# Patient Record
Sex: Male | Born: 1990 | Race: White | Hispanic: No | Marital: Married | Smoking: Never smoker
Health system: Southern US, Community
[De-identification: ages and names within clinical notes are randomized; demographics above are authoritative.]

## PROBLEM LIST (undated history)

## (undated) DIAGNOSIS — F32A Depression, unspecified: Secondary | ICD-10-CM

## (undated) DIAGNOSIS — K219 Gastro-esophageal reflux disease without esophagitis: Secondary | ICD-10-CM

## (undated) DIAGNOSIS — T7840XA Allergy, unspecified, initial encounter: Secondary | ICD-10-CM

## (undated) DIAGNOSIS — I10 Essential (primary) hypertension: Secondary | ICD-10-CM

## (undated) DIAGNOSIS — G43909 Migraine, unspecified, not intractable, without status migrainosus: Secondary | ICD-10-CM

## (undated) DIAGNOSIS — N200 Calculus of kidney: Secondary | ICD-10-CM

## (undated) HISTORY — DX: Allergy, unspecified, initial encounter: T78.40XA

## (undated) HISTORY — DX: Gastro-esophageal reflux disease without esophagitis: K21.9

## (undated) HISTORY — PX: LITHOTRIPSY: SUR834

## (undated) HISTORY — PX: KIDNEY STONE SURGERY: SHX686

## (undated) HISTORY — DX: Migraine, unspecified, not intractable, without status migrainosus: G43.909

## (undated) HISTORY — DX: Essential (primary) hypertension: I10

## (undated) HISTORY — DX: Depression, unspecified: F32.A

---

## 2015-01-09 ENCOUNTER — Emergency Department: Payer: BC Managed Care – PPO

## 2015-01-09 ENCOUNTER — Emergency Department
Admission: EM | Admit: 2015-01-09 | Discharge: 2015-01-09 | Disposition: A | Discharge status: Home / Self Care | Payer: BC Managed Care – PPO | Attending: Emergency Medicine | Admitting: Emergency Medicine

## 2015-01-09 ENCOUNTER — Encounter: Payer: Self-pay | Admitting: Medical Oncology

## 2015-01-09 DIAGNOSIS — N23 Unspecified renal colic: Secondary | ICD-10-CM | POA: Diagnosis not present

## 2015-01-09 DIAGNOSIS — R109 Unspecified abdominal pain: Secondary | ICD-10-CM | POA: Diagnosis present

## 2015-01-09 HISTORY — DX: Calculus of kidney: N20.0

## 2015-01-09 LAB — COMPREHENSIVE METABOLIC PANEL
ALT: 20 U/L (ref 17–63)
AST: 22 U/L (ref 15–41)
Albumin: 4.5 g/dL (ref 3.5–5.0)
Alkaline Phosphatase: 70 U/L (ref 38–126)
Anion gap: 9 (ref 5–15)
BILIRUBIN TOTAL: 0.6 mg/dL (ref 0.3–1.2)
BUN: 20 mg/dL (ref 6–20)
CHLORIDE: 102 mmol/L (ref 101–111)
CO2: 28 mmol/L (ref 22–32)
Calcium: 9.8 mg/dL (ref 8.9–10.3)
Creatinine, Ser: 1.05 mg/dL (ref 0.61–1.24)
GFR calc Af Amer: >60 mL/min (ref >60)
GLUCOSE: 105 mg/dL — AB (ref 65–99)
POTASSIUM: 3.6 mmol/L (ref 3.5–5.1)
SODIUM: 139 mmol/L (ref 135–145)
Total Protein: 7.2 g/dL (ref 6.5–8.1)

## 2015-01-09 LAB — URINALYSIS COMPLETE WITH MICROSCOPIC (ARMC ONLY)
Bacteria, UA: NONE SEEN
Bilirubin Urine: NEGATIVE
Glucose, UA: NEGATIVE mg/dL
Ketones, ur: NEGATIVE mg/dL
Leukocytes, UA: NEGATIVE
NITRITE: NEGATIVE
PH: 6 (ref 5.0–8.0)
Protein, ur: NEGATIVE mg/dL
SPECIFIC GRAVITY, URINE: 1.024 (ref 1.005–1.030)
Squamous Epithelial / LPF: NONE SEEN

## 2015-01-09 LAB — CBC WITH DIFFERENTIAL/PLATELET
Basophils Absolute: 0 10*3/uL (ref 0–0.1)
Basophils Relative: 0 %
EOS ABS: 0 10*3/uL (ref 0–0.7)
EOS PCT: 0 %
HCT: 41.4 % (ref 40.0–52.0)
HEMOGLOBIN: 14.5 g/dL (ref 13.0–18.0)
LYMPHS ABS: 1.1 10*3/uL (ref 1.0–3.6)
Lymphocytes Relative: 10 %
MCH: 30 pg (ref 26.0–34.0)
MCHC: 34.9 g/dL (ref 32.0–36.0)
MCV: 86 fL (ref 80.0–100.0)
MONOS PCT: 5 %
Monocytes Absolute: 0.5 10*3/uL (ref 0.2–1.0)
Neutro Abs: 8.6 10*3/uL — ABNORMAL HIGH (ref 1.4–6.5)
Neutrophils Relative %: 85 %
Platelets: 251 10*3/uL (ref 150–440)
RBC: 4.82 MIL/uL (ref 4.40–5.90)
RDW: 12.2 % (ref 11.5–14.5)
WBC: 10.3 10*3/uL (ref 3.8–10.6)

## 2015-01-09 LAB — LIPASE, BLOOD: Lipase: 23 U/L (ref 22–51)

## 2015-01-09 MED ORDER — ONDANSETRON HCL 4 MG/2ML IJ SOLN
4.0000 mg | Freq: Once | INTRAMUSCULAR | Status: AC
  Administered 2015-01-09: 4 mg via INTRAVENOUS
  Filled 2015-01-09: qty 2

## 2015-01-09 MED ORDER — ONDANSETRON HCL 4 MG PO TABS: 4.0000 mg | ORAL_TABLET | Freq: Every day | ORAL | Status: DC | PRN

## 2015-01-09 MED ORDER — OXYCODONE-ACETAMINOPHEN 5-325 MG PO TABS
2.0000 | ORAL_TABLET | Freq: Once | ORAL | Status: AC
  Administered 2015-01-09: 2 via ORAL
  Filled 2015-01-09: qty 2

## 2015-01-09 MED ORDER — KETOROLAC TROMETHAMINE 30 MG/ML IJ SOLN
30.0000 mg | Freq: Once | INTRAMUSCULAR | Status: AC
  Administered 2015-01-09: 30 mg via INTRAVENOUS
  Filled 2015-01-09: qty 1

## 2015-01-09 MED ORDER — TAMSULOSIN HCL 0.4 MG PO CAPS: 0.4000 mg | ORAL_CAPSULE | Freq: Every day | ORAL | Status: DC

## 2015-01-09 MED ORDER — SODIUM CHLORIDE 0.9 % IV SOLN
1000.0000 mL | Freq: Once | INTRAVENOUS | Status: AC
  Administered 2015-01-09: 1000 mL via INTRAVENOUS

## 2015-01-09 MED ORDER — OXYCODONE-ACETAMINOPHEN 5-325 MG PO TABS: 1.0000 | ORAL_TABLET | Freq: Four times a day (QID) | ORAL | Status: DC | PRN

## 2015-01-09 MED ORDER — TAMSULOSIN HCL 0.4 MG PO CAPS
0.4000 mg | ORAL_CAPSULE | Freq: Once | ORAL | Status: AC
  Administered 2015-01-09: 0.4 mg via ORAL
  Filled 2015-01-09: qty 1

## 2015-01-09 NOTE — ED Provider Notes (Signed)
[facility] Emergency Department Provider Note     Time seen: ----------------------------------------- 2:14 PM on 01/09/2015 -----------------------------------------    I have reviewed the triage vital signs and the nursing notes.   HISTORY  Chief Complaint Flank Pain    HPI Willie Randolph is a 24 y.o. male since ER with left flank pain that began about 30 minutes prior to arrival. [place] and stabbing, with nausea associated and near syncope. Patient dates she has a history of kidney stones and this feels similar. Pain was sudden onset, nothing makes it better or worse.   Past Medical History  Diagnosis Date  . Kidney stones     There are no active problems to display for this patient.   Past Surgical History  Procedure Laterality Date  . Kidney stone surgery      Allergies Review of patient's allergies indicates no known allergies.  Social History Social History  Substance Use Topics  . Smoking status: Never Smoker   . Smokeless tobacco: None  . Alcohol Use: Yes     Comment: occ    Review of Systems Constitutional: Negative for fever. Eyes: Negative for visual changes. ENT: Negative for sore throat. Cardiovascular: Negative for chest pain. Respiratory: Negative for shortness of breath. Gastrointestinal: Positive for left flank pain and nausea Genitourinary: Negative for dysuria. Musculoskeletal: Negative for back pain. Skin: Negative for rash. Neurological: Negative for headaches, focal weakness or numbness.  10-point ROS otherwise negative.  ____________________________________________   PHYSICAL EXAM:  VITAL SIGNS: ED Triage Vitals  Enc Vitals Group     BP 01/09/15 1408 99/61 mmHg     Pulse Rate 01/09/15 1408 65     Resp 01/09/15 1408 20     Temp 01/09/15 1408 98.4 F (36.9 C)     Temp Source 01/09/15 1408 Oral     SpO2 01/09/15 1408 98 %     Weight 01/09/15 1408 215 lb (97.523 kg)     Height 01/09/15 1408   (1.753 m)     Head Cir --      Peak Flow --      Pain Score 01/09/15 1409 10     Pain Loc --      Pain Edu? --      Excl. in GC? --     Constitutional: Alert and oriented. Mild distress Eyes: Conjunctivae are normal. PERRL. Normal extraocular movements. ENT   Head: Normocephalic and atraumatic.   Nose: No congestion/rhinnorhea.   Mouth/Throat: Mucous membranes are moist.   Neck: No stridor. Cardiovascular: Normal rate, regular rhythm. Normal and symmetric distal pulses are present in all extremities. No murmurs, rubs, or gallops. Respiratory: Normal respiratory effort without tachypnea nor retractions. Breath sounds are clear and equal bilaterally. No wheezes/rales/rhonchi. Gastrointestinal: Soft and nontender. No distention. No abdominal bruits.  Musculoskeletal: Nontender with normal range of motion in all extremities. No joint effusions.  No lower extremity tenderness nor edema. Neurologic:  Normal speech and language. No gross focal neurologic deficits are appreciated. Speech is normal. No gait instability. Skin:  Skin is warm, dry and intact. No rash noted. Psychiatric: Mood and affect are normal. Speech and behavior are normal. Patient exhibits appropriate insight and judgment. ____________________________________________  ED COURSE:  Pertinent labs & imaging results that were available during my care of the patient were reviewed by me and considered in my medical decision making (see chart for details). Patient in mild distress, will receive IV fluids, Toradol, Zofran. ____________________________________________    LABS (pertinent positives/negatives)  Labs Reviewed  URINALYSIS COMPLETEWITH MICROSCOPIC ([facility] ONLY) - Abnormal; Notable for the following:    Color, Urine YELLOW (*)    APPearance CLEAR (*)    Hgb urine dipstick 1+ (*)    All other components within normal limits  CBC WITH DIFFERENTIAL/PLATELET  COMPREHENSIVE METABOLIC PANEL  LIPASE, BLOOD     RADIOLOGY Images were viewed by me  KUB IMPRESSION: 4 mm right renal calculus. ____________________________________________  FINAL ASSESSMENT AND PLAN  Renal colic  Plan: Patient with labs and imaging as dictated above. Stone is likely too small to see on KUB. Patient with classic symptoms for renal colic with hematuria. At this point is stable for discharge with pain medication, Flomax and urology referral.   Emily Filbert, MD   Emily Filbert, MD 01/09/15 [phone]

## 2015-01-09 NOTE — Discharge Instructions (Signed)

## 2015-01-09 NOTE — ED Notes (Signed)
Pt ambulatory to triage with reports of left flank pain that began pta. Pt has hx of kidney stones, feels similar.

## 2015-01-14 ENCOUNTER — Encounter: Payer: Self-pay | Admitting: Emergency Medicine

## 2015-01-14 ENCOUNTER — Emergency Department
Admission: EM | Admit: 2015-01-14 | Discharge: 2015-01-14 | Disposition: A | Discharge status: Home / Self Care | Payer: BC Managed Care – PPO | Attending: Emergency Medicine | Admitting: Emergency Medicine

## 2015-01-14 ENCOUNTER — Encounter: Payer: Self-pay | Admitting: Urgent Care

## 2015-01-14 ENCOUNTER — Emergency Department: Payer: BC Managed Care – PPO

## 2015-01-14 DIAGNOSIS — R42 Dizziness and giddiness: Secondary | ICD-10-CM | POA: Diagnosis not present

## 2015-01-14 DIAGNOSIS — Z79899 Other long term (current) drug therapy: Secondary | ICD-10-CM | POA: Insufficient documentation

## 2015-01-14 DIAGNOSIS — R110 Nausea: Secondary | ICD-10-CM | POA: Insufficient documentation

## 2015-01-14 DIAGNOSIS — R1030 Lower abdominal pain, unspecified: Secondary | ICD-10-CM | POA: Insufficient documentation

## 2015-01-14 DIAGNOSIS — N200 Calculus of kidney: Secondary | ICD-10-CM | POA: Insufficient documentation

## 2015-01-14 DIAGNOSIS — M545 Low back pain: Secondary | ICD-10-CM | POA: Insufficient documentation

## 2015-01-14 DIAGNOSIS — R109 Unspecified abdominal pain: Secondary | ICD-10-CM | POA: Diagnosis present

## 2015-01-14 LAB — URINALYSIS COMPLETE WITH MICROSCOPIC (ARMC ONLY)
Bacteria, UA: NONE SEEN
Bilirubin Urine: NEGATIVE
Glucose, UA: NEGATIVE mg/dL
Ketones, ur: NEGATIVE mg/dL
LEUKOCYTES UA: NEGATIVE
Nitrite: NEGATIVE
PH: 6 (ref 5.0–8.0)
PROTEIN: NEGATIVE mg/dL
Specific Gravity, Urine: 1.012 (ref 1.005–1.030)

## 2015-01-14 LAB — CBC
HCT: 41.7 % (ref 40.0–52.0)
Hemoglobin: 14.4 g/dL (ref 13.0–18.0)
MCH: 29.9 pg (ref 26.0–34.0)
MCHC: 34.5 g/dL (ref 32.0–36.0)
MCV: 86.7 fL (ref 80.0–100.0)
Platelets: 235 10*3/uL (ref 150–440)
RBC: 4.81 MIL/uL (ref 4.40–5.90)
RDW: 12.4 % (ref 11.5–14.5)
WBC: 5.2 10*3/uL (ref 3.8–10.6)

## 2015-01-14 LAB — COMPREHENSIVE METABOLIC PANEL
ALT: 23 U/L (ref 17–63)
AST: 26 U/L (ref 15–41)
Albumin: 4.8 g/dL (ref 3.5–5.0)
Alkaline Phosphatase: 73 U/L (ref 38–126)
Anion gap: 10 (ref 5–15)
BUN: 15 mg/dL (ref 6–20)
CHLORIDE: 102 mmol/L (ref 101–111)
CO2: 25 mmol/L (ref 22–32)
Calcium: 9.3 mg/dL (ref 8.9–10.3)
Creatinine, Ser: 1.13 mg/dL (ref 0.61–1.24)
Glucose, Bld: 107 mg/dL — ABNORMAL HIGH (ref 65–99)
Potassium: 3.6 mmol/L (ref 3.5–5.1)
SODIUM: 137 mmol/L (ref 135–145)
Total Bilirubin: 0.5 mg/dL (ref 0.3–1.2)
Total Protein: 7.3 g/dL (ref 6.5–8.1)

## 2015-01-14 LAB — LIPASE, BLOOD: LIPASE: 23 U/L (ref 22–51)

## 2015-01-14 MED ORDER — KETOROLAC TROMETHAMINE 30 MG/ML IJ SOLN
30.0000 mg | Freq: Once | INTRAMUSCULAR | Status: AC
  Administered 2015-01-14: 30 mg via INTRAVENOUS
  Filled 2015-01-14: qty 1

## 2015-01-14 MED ORDER — ONDANSETRON HCL 4 MG/2ML IJ SOLN
INTRAMUSCULAR | Status: AC
  Administered 2015-01-14: 4 mg via INTRAVENOUS
  Filled 2015-01-14: qty 2

## 2015-01-14 MED ORDER — HYDROMORPHONE HCL 1 MG/ML IJ SOLN
1.0000 mg | Freq: Once | INTRAMUSCULAR | Status: AC
  Administered 2015-01-14: 1 mg via INTRAVENOUS

## 2015-01-14 MED ORDER — KETOROLAC TROMETHAMINE 30 MG/ML IJ SOLN
INTRAMUSCULAR | Status: AC
  Administered 2015-01-14: 30 mg via INTRAVENOUS
  Filled 2015-01-14: qty 1

## 2015-01-14 MED ORDER — HYDROMORPHONE HCL 2 MG PO TABS: 2.0000 mg | ORAL_TABLET | Freq: Two times a day (BID) | ORAL | Status: DC | PRN

## 2015-01-14 MED ORDER — HYDROMORPHONE HCL 1 MG/ML IJ SOLN
INTRAMUSCULAR | Status: AC
  Administered 2015-01-14: 1 mg via INTRAVENOUS
  Filled 2015-01-14: qty 1

## 2015-01-14 MED ORDER — HYDROMORPHONE HCL 1 MG/ML IJ SOLN
0.5000 mg | Freq: Once | INTRAMUSCULAR | Status: AC
Start: 2015-01-14 — End: 2015-01-14
  Administered 2015-01-14: 0.5 mg via INTRAVENOUS
  Filled 2015-01-14: qty 1

## 2015-01-14 MED ORDER — KETOROLAC TROMETHAMINE 30 MG/ML IJ SOLN
30.0000 mg | Freq: Once | INTRAMUSCULAR | Status: AC
  Administered 2015-01-14: 30 mg via INTRAVENOUS

## 2015-01-14 MED ORDER — ONDANSETRON HCL 4 MG/2ML IJ SOLN
4.0000 mg | Freq: Once | INTRAMUSCULAR | Status: AC
  Administered 2015-01-14: 4 mg via INTRAVENOUS
  Filled 2015-01-14: qty 2

## 2015-01-14 MED ORDER — ONDANSETRON HCL 4 MG/2ML IJ SOLN
4.0000 mg | Freq: Once | INTRAMUSCULAR | Status: AC
  Administered 2015-01-14: 4 mg via INTRAVENOUS

## 2015-01-14 NOTE — ED Notes (Signed)
Patient presents with c/o LEFT flank pain since Wednesday. (+) N/V reported. Of note, patient was seen here earlier today for the same and advised that he has a 3mm stone in his LEFT ureter. (+) hematemesis reported. Cites that this feels different than previous stones. Rx'd Dilaudid to go home per his report.

## 2015-01-14 NOTE — ED Provider Notes (Signed)
Time Seen: Approximately ----------------------------------------- 3:41 PM on 01/14/2015 -----------------------------------------    I have reviewed the triage notes  Chief Complaint: Abdominal Pain   History of Present Illness: Angeldejesus Callaham is a 24 y.o. male who presents with repeat episode of left-sided abdominal pain. The patient was seen here on August 10 and was diagnosed with renal colic. He had a plain x-ray which showed a 4 mm calculus on the right side but no obvious evidence of a calculus on the left. Skin denies any gross hematuria but does have microscopic hematuria on his last visit and also on today's lab review. He states the pain seems to be migratory toward the left lower abdominal region toward his left groin without any testicular pain or masses. He's had some nausea as felt somewhat lightheaded. No syncope he denies any fever at home.   Past Medical History  Diagnosis Date  . Kidney stones     There are no active problems to display for this patient.   Past Surgical History  Procedure Laterality Date  . Kidney stone surgery    . Lithotripsy      Past Surgical History  Procedure Laterality Date  . Kidney stone surgery    . Lithotripsy      Current Outpatient Rx  Name  Route  Sig  Dispense  Refill  . ondansetron (ZOFRAN) 4 MG tablet   Oral   Take 1 tablet (4 mg total) by mouth daily as needed for nausea or vomiting.   20 tablet   1   . oxyCODONE-acetaminophen (ROXICET) 5-325 MG per tablet   Oral   Take 1 tablet by mouth every 6 (six) hours as needed.   20 tablet   0   . tamsulosin (FLOMAX) 0.4 MG CAPS capsule   Oral   Take 1 capsule (0.4 mg total) by mouth daily after breakfast.   30 capsule   0     Allergies:  Review of patient's allergies indicates no known allergies.  Family History: No family history on file.  Social History: Social History  Substance Use Topics  . Smoking status: Never Smoker   . Smokeless tobacco: None   . Alcohol Use: Yes     Comment: occ     Review of Systems:   10 point review of systems was performed and was otherwise negative:  Constitutional: No fever Eyes: No visual disturbances ENT: No sore throat, ear pain Cardiac: No chest pain Respiratory: No shortness of breath, wheezing, or stridor Abdomen: Mainly left-sided lower back and abdominal pain, no vomiting, No diarrhea Endocrine: No weight loss, No night sweats Extremities: No peripheral edema, cyanosis Skin: No rashes, easy bruising Neurologic: No focal weakness, trouble with speech or swollowing Urologic: No dysuria, Hematuria, or urinary frequency No testicular pain or masses  Physical Exam:  ED Triage Vitals  Enc Vitals Group     BP 01/14/15 1419 150/87 mmHg     Pulse Rate 01/14/15 1419 87     Resp 01/14/15 1419 16     Temp 01/14/15 1419 98.4 F (36.9 C)     Temp Source 01/14/15 1419 Oral     SpO2 01/14/15 1419 97 %     Weight 01/14/15 1419 215 lb (97.523 kg)     Height 01/14/15 1419 5\' 9"  (1.753 m)     Head Cir --      Peak Flow --      Pain Score 01/14/15 1419 8     Pain Loc --  Pain Edu? --      Excl. in GC? --     General: Awake , Alert , and Oriented times 3; GCS 15 Head: Normal cephalic , atraumatic Eyes: Pupils equal , round, reactive to light Nose/Throat: No nasal drainage, patent upper airway without erythema or exudate.  Neck: Supple, Full range of motion, No anterior adenopathy or palpable thyroid masses Lungs: Clear to ascultation without wheezes , rhonchi, or rales Heart: Regular rate, regular rhythm without murmurs , gallops , or rubs Abdomen: Soft, non tender without rebound, guarding , or rigidity; bowel sounds positive and symmetric in all 4 quadrants. No organomegaly .        Extremities: 2 plus symmetric pulses. No edema, clubbing or cyanosis Neurologic: normal ambulation, Motor symmetric without deficits, sensory intact Skin: warm, dry, no rashes   Labs:   All laboratory  work was reviewed including any pertinent negatives or positives listed below:  Labs Reviewed  COMPREHENSIVE METABOLIC PANEL - Abnormal; Notable for the following:    Glucose, Bld 107 (*)    All other components within normal limits  URINALYSIS COMPLETEWITH MICROSCOPIC ([facility] ONLY) - Abnormal; Notable for the following:    Color, Urine YELLOW (*)    APPearance CLEAR (*)    Hgb urine dipstick 2+ (*)    Squamous Epithelial / LPF 0-5 (*)    All other components within normal limits  LIPASE, BLOOD  CBC   urine has too numerous to count red blood cells  EKG: None   Radiology:  I personally reviewed the radiologic studies   Abdominal images demonstrate a normal liver, spleen, pancreas, gallbladder and adrenal glands. Appendix is normal. Vascular structures are within normal.  Kidneys are normal in size. There is a 5 mm stone over the mid pole collecting system of the right kidney. There is minimal prominence of the upper pole collecting system of the right kidney and right renal pelvis. There is minimal prominence of the left renal collecting system. The right ureter is normal. There is a 3 mm stone at the left UVJ.  No free fluid or focal inflammatory change. Mesenteries within normal. Colon small bowel are within normal.  Pelvic images demonstrate the prostate and rectum to be within normal. No free fluid. Remaining bones and soft tissues are within normal.  IMPRESSION: Right-sided nephrolithiasis. No left renal stones. 3 mm stone at the left UVJ causing mild obstruction.   Critical Care: None    ED Course: Differential diagnosis includes but is not exclusive to acute appendicitis, renal colic, testicular torsion, urinary tract infection, prostatitis,  diverticulitis, small bowel obstruction, colitis, abdominal aortic aneurysm, gastroenteritis, etc.    Patient's stay was uneventful, he was given IV Toradol for pain along with Zofran for nausea and low-dose Dilaudid  IV. Patient's felt symptomatically improved and reviewed his laboratory work and CAT scan does not show any complications associated with his kidney stone. Patient was advised continue with his follow-up visit had been predetermined on his last visit to contact the urologist and continue the Flomax. He was also advised to take over-the-counter ibuprofen for pain if needed. Assessment: Left-sided renal colic   Final Clinical Impression: Left-sided renal colic Final diagnoses:  Left sided abdominal pain     Plan: Patient was advised to return immediately if condition worsens. Patient was advised to follow up with her primary care physician or other specialized physicians involved and in their current assessment.             Arlys John  Renaee Munda, MD 01/14/15 [phone]

## 2015-01-14 NOTE — ED Notes (Signed)
Spoke with Huel Cote, MD regarding presenting c/o and made him aware that patient was seen here earlier today by him. MD with VORB for PIV, Zofran  IV, Dilaudid  IV, and Toradol  IV. Orders to be entered and carried by this RN.

## 2015-01-14 NOTE — ED Notes (Signed)
Pt seen here the other day for kidney stone, c/o left lower back pain continues, reports no improvement with  oxycodone. Pt also reports LLQ pain.

## 2015-01-15 ENCOUNTER — Emergency Department
Admission: EM | Admit: 2015-01-15 | Discharge: 2015-01-15 | Disposition: A | Discharge status: Home / Self Care | Payer: BC Managed Care – PPO | Attending: Emergency Medicine | Admitting: Emergency Medicine

## 2015-01-15 DIAGNOSIS — N200 Calculus of kidney: Secondary | ICD-10-CM

## 2015-01-15 DIAGNOSIS — R109 Unspecified abdominal pain: Secondary | ICD-10-CM

## 2015-01-15 LAB — URINALYSIS COMPLETE WITH MICROSCOPIC (ARMC ONLY)
BILIRUBIN URINE: NEGATIVE
GLUCOSE, UA: NEGATIVE mg/dL
KETONES UR: NEGATIVE mg/dL
Leukocytes, UA: NEGATIVE
NITRITE: NEGATIVE
PH: 6 (ref 5.0–8.0)
Protein, ur: NEGATIVE mg/dL
Specific Gravity, Urine: 1.015 (ref 1.005–1.030)

## 2015-01-15 LAB — CBC
HCT: 41.7 % (ref 40.0–52.0)
Hemoglobin: 14.3 g/dL (ref 13.0–18.0)
MCH: 29.8 pg (ref 26.0–34.0)
MCHC: 34.3 g/dL (ref 32.0–36.0)
MCV: 86.8 fL (ref 80.0–100.0)
PLATELETS: 245 10*3/uL (ref 150–440)
RBC: 4.8 MIL/uL (ref 4.40–5.90)
RDW: 12.5 % (ref 11.5–14.5)
WBC: 12 10*3/uL — AB (ref 3.8–10.6)

## 2015-01-15 LAB — COMPREHENSIVE METABOLIC PANEL
ALT: 23 U/L (ref 17–63)
ANION GAP: 10 (ref 5–15)
AST: 22 U/L (ref 15–41)
Albumin: 4.9 g/dL (ref 3.5–5.0)
Alkaline Phosphatase: 74 U/L (ref 38–126)
BUN: 20 mg/dL (ref 6–20)
CHLORIDE: 102 mmol/L (ref 101–111)
CO2: 27 mmol/L (ref 22–32)
Calcium: 9.5 mg/dL (ref 8.9–10.3)
Creatinine, Ser: 1.61 mg/dL — ABNORMAL HIGH (ref 0.61–1.24)
GFR, EST NON AFRICAN AMERICAN: 59 mL/min — AB (ref >60)
Glucose, Bld: 120 mg/dL — ABNORMAL HIGH (ref 65–99)
POTASSIUM: 4.2 mmol/L (ref 3.5–5.1)
Sodium: 139 mmol/L (ref 135–145)
TOTAL PROTEIN: 7.3 g/dL (ref 6.5–8.1)
Total Bilirubin: 0.7 mg/dL (ref 0.3–1.2)

## 2015-01-15 MED ORDER — HYDROMORPHONE HCL 1 MG/ML IJ SOLN
INTRAMUSCULAR | Status: AC
  Administered 2015-01-15: 1 mg via INTRAVENOUS
  Filled 2015-01-15: qty 1

## 2015-01-15 MED ORDER — SODIUM CHLORIDE 0.9 % IV BOLUS (SEPSIS)
1000.0000 mL | Freq: Once | INTRAVENOUS | Status: AC
  Administered 2015-01-15: 1000 mL via INTRAVENOUS

## 2015-01-15 MED ORDER — HYDROMORPHONE HCL 1 MG/ML IJ SOLN
1.0000 mg | Freq: Once | INTRAMUSCULAR | Status: AC
Start: 2015-01-15 — End: 2015-01-15
  Administered 2015-01-15: 1 mg via INTRAVENOUS

## 2015-01-15 MED ORDER — ONDANSETRON HCL 4 MG/2ML IJ SOLN
4.0000 mg | Freq: Once | INTRAMUSCULAR | Status: AC
  Administered 2015-01-15: 4 mg via INTRAVENOUS

## 2015-01-15 MED ORDER — ONDANSETRON HCL 4 MG/2ML IJ SOLN
INTRAMUSCULAR | Status: AC
  Administered 2015-01-15: 4 mg via INTRAVENOUS
  Filled 2015-01-15: qty 2

## 2015-01-15 MED ORDER — HYDROMORPHONE HCL 1 MG/ML IJ SOLN
1.0000 mg | Freq: Once | INTRAMUSCULAR | Status: AC
  Administered 2015-01-15: 1 mg via INTRAVENOUS
  Filled 2015-01-15: qty 1

## 2015-01-15 NOTE — Discharge Instructions (Signed)

## 2015-01-15 NOTE — ED Provider Notes (Signed)
[facility] Emergency Department Provider Note  ____________________________________________  Time seen: Approximately 225 AM  I have reviewed the triage vital signs and the nursing notes.   HISTORY  Chief Complaint Flank Pain and Hematemesis    HPI Willie Randolph is a 24 y.o. male who was recently diagnosed with a kidney stone. The patient reports that he returned today as he was having some continued left flank pain. The patient reports that he was sent home with stronger pain medicine but as soon as he got home and started taking the medication the pain continued to worsen. The patient reports that he's had some severe left flank and abdominal pain. Patient reports that the pain seems to be between his kidney in his bladder. The patient reports that he is concerned the stone may be stuck in blocking his ureter. Currently his pain is 8 out of 10 in intensity. The patient has been vomiting with the last episode having some blood in it. The plan was to call the urologist in the morning but he reports that he does not think he can make it home anymore. The patient was initially taking oxycodone. The patient also feels some dizziness and blurred vision after the vomiting. A shunt has had a kidney stone before which required a stent to be placed.   Past Medical History  Diagnosis Date  . Kidney stones     There are no active problems to display for this patient.   Past Surgical History  Procedure Laterality Date  . Kidney stone surgery    . Lithotripsy      Current Outpatient Rx  Name  Route  Sig  Dispense  Refill  . HYDROmorphone (DILAUDID) 2 MG tablet   Oral   Take 1 tablet (2 mg total) by mouth every 12 (twelve) hours as needed for severe pain.   20 tablet   0   . ondansetron (ZOFRAN) 4 MG tablet   Oral   Take 1 tablet (4 mg total) by mouth daily as needed for nausea or vomiting.   20 tablet   1   . oxyCODONE-acetaminophen (ROXICET) 5-325 MG per  tablet   Oral   Take 1 tablet by mouth every 6 (six) hours as needed.   20 tablet   0   . tamsulosin (FLOMAX) 0.4 MG CAPS capsule   Oral   Take 1 capsule (0.4 mg total) by mouth daily after breakfast.   30 capsule   0     Allergies Review of patient's allergies indicates no known allergies.  No family history on file.  Social History Social History  Substance Use Topics  . Smoking status: Never Smoker   . Smokeless tobacco: None  . Alcohol Use: Yes     Comment: occ    Review of Systems Constitutional: No fever/chills Eyes: No visual changes. ENT: No sore throat. Cardiovascular: Denies chest pain. Respiratory: Denies shortness of breath. Gastrointestinal: Abdominal pain and flank pain nausea and vomiting Genitourinary: Negative for dysuria. Musculoskeletal: Left flank pain Skin: Negative for rash. Neurological: Dizziness  10-point ROS otherwise negative.  ____________________________________________   PHYSICAL EXAM:  VITAL SIGNS: ED Triage Vitals  Enc Vitals Group     BP 01/14/15 2250 150/67 mmHg     Pulse Rate 01/14/15 2250 85     Resp 01/14/15 2250 20     Temp 01/14/15 2250 98.4 F (36.9 C)     Temp Source 01/14/15 2250 Oral     SpO2 01/14/15 2250 100 %  Weight 01/14/15 2250 215 lb (97.523 kg)     Height 01/14/15 2250  (1.753 m)     Head Cir --      Peak Flow --      Pain Score 01/14/15 2250 10     Pain Loc --      Pain Edu? --      Excl. in GC? --     Constitutional: Alert and oriented. Well appearing and in moderate distress. Eyes: Conjunctivae are normal. PERRL. EOMI. Head: Atraumatic. Nose: No congestion/rhinnorhea. Mouth/Throat: Mucous membranes are moist.  Oropharynx non-erythematous. Cardiovascular: Normal rate, regular rhythm. Grossly normal heart sounds.  Good peripheral circulation. Respiratory: Normal respiratory effort.  No retractions. Lungs CTAB. Gastrointestinal: Soft with left-sided abdominal pain to palpation. No  distention. Positive bowel sounds, left CVA tenderness to palpation Musculoskeletal: No lower extremity tenderness nor edema.   Neurologic:  Normal speech and language. No gross focal neurologic deficits are appreciated.  Skin:  Skin is warm, dry and intact.  Psychiatric: Mood and affect are normal. .  ____________________________________________   LABS (all labs ordered are listed, but only abnormal results are displayed)  Labs Reviewed  CBC - Abnormal; Notable for the following:    WBC 12.0 (*)    All other components within normal limits  COMPREHENSIVE METABOLIC PANEL - Abnormal; Notable for the following:    Glucose, Bld 120 (*)    Creatinine, Ser 1.61 (*)    GFR calc non Af Amer 59 (*)    All other components within normal limits  URINALYSIS COMPLETEWITH MICROSCOPIC ([facility] ONLY) - Abnormal; Notable for the following:    Color, Urine YELLOW (*)    APPearance CLEAR (*)    Hgb urine dipstick 3+ (*)    Bacteria, UA RARE (*)    Squamous Epithelial / LPF 0-5 (*)    All other components within normal limits   ____________________________________________  EKG  None ____________________________________________  RADIOLOGY  None ____________________________________________   PROCEDURES  Procedure(s) performed: None  Critical Care performed: No  ____________________________________________   INITIAL IMPRESSION / ASSESSMENT AND PLAN / ED COURSE  Pertinent labs & imaging results that were available during my care of the patient were reviewed by me and considered in my medical decision making (see chart for details).  This is a 24 year old male with a history of kidney stone comes in today with left flank pain and recent diagnosis of kidney stone. The patient reports that he would like to be admitted to the hospital. I will contact the urologist to determine admission versus follow-up in the morning. The patient did receive 3 mg of Dilaudid as well as Zofran and normal  saline.   I contacted the urologist on-call Dr. Ronne Binning and he agreed that he would be able to see the patient in the clinic today. The patient needs to call to make his appointment today. The patient will be discharged home to follow-up with his urologist today. ____________________________________________   FINAL CLINICAL IMPRESSION(S) / ED DIAGNOSES  Final diagnoses:  Flank pain  Kidney stone      Rebecka Apley, MD 01/15/15 [phone]

## 2015-01-15 NOTE — ED Notes (Signed)
Patient has been to the desk several times asking for pain medications. Patient with known kidney stone and pain has increased back to a 10/10. Of note, this RN in another assignment, however order obtained and nursing monitoring patient made aware. Patient back to desk advising that he had not been given any additional medications. This RN obtained drugs from pyxis and administered per MD order.

## 2016-04-09 IMAGING — CT CT RENAL STONE PROTOCOL
1 of 2 series · 4 of 32 positions shown, 9 images · non-contrast
Comparison: None.

CLINICAL DATA: Left lower back pain and left lower quadrant pain.
History of kidney stones.

EXAM:
CT ABDOMEN AND PELVIS WITHOUT CONTRAST
TECHNIQUE: Multidetector CT imaging of the abdomen and pelvis was performed
following the standard protocol without IV contrast.

[Series 4: lung windows · axial · 0.81mm/px · z∈[+22,+87]mm · 4 of 23 slices shown, 9 images]
[im 5/23  soft-tissue]
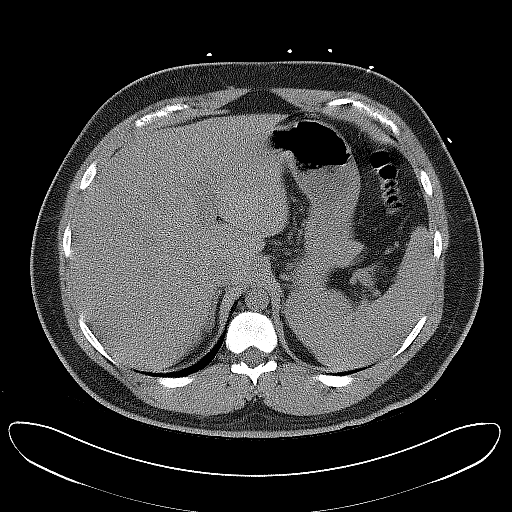
[im 5/23  lung]
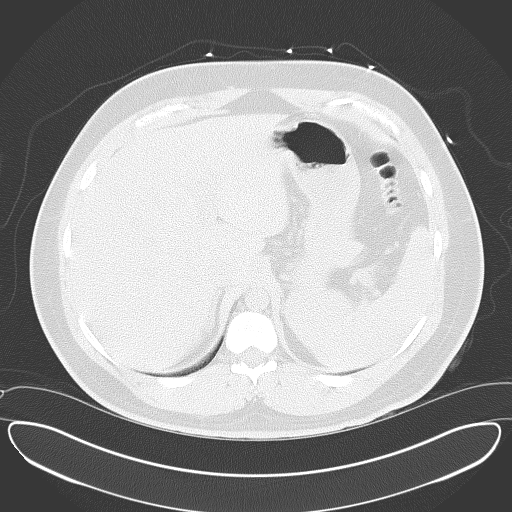
[im 5/23  bone]
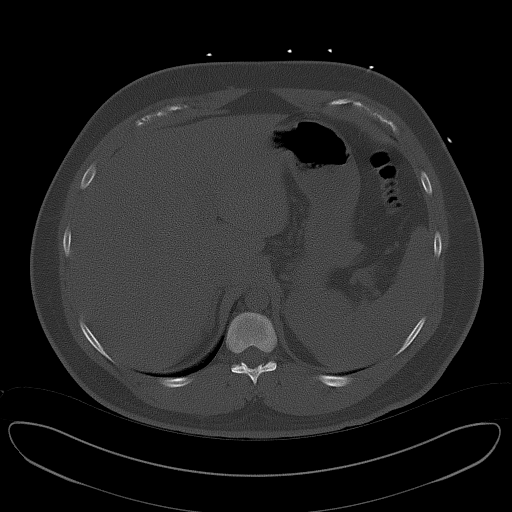
[im 9/23  soft-tissue]
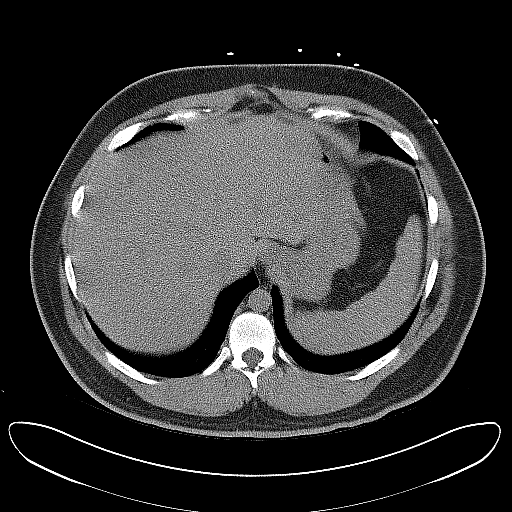
[im 9/23  lung]
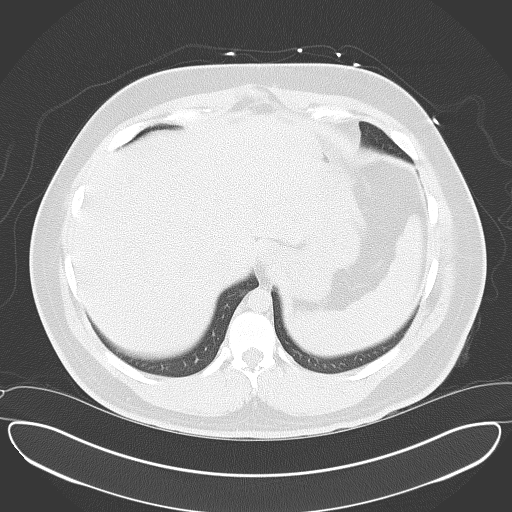
[im 14/23  soft-tissue]
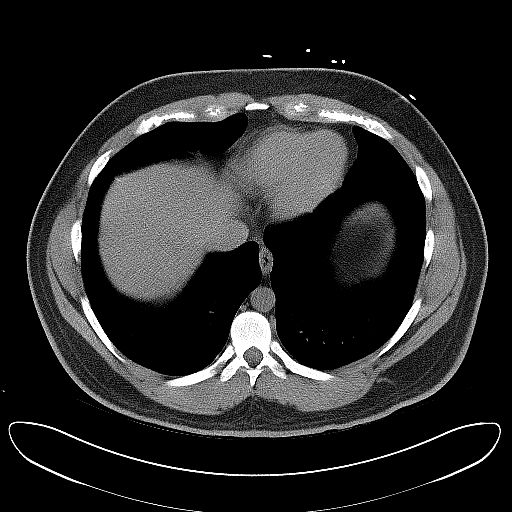
[im 14/23  lung]
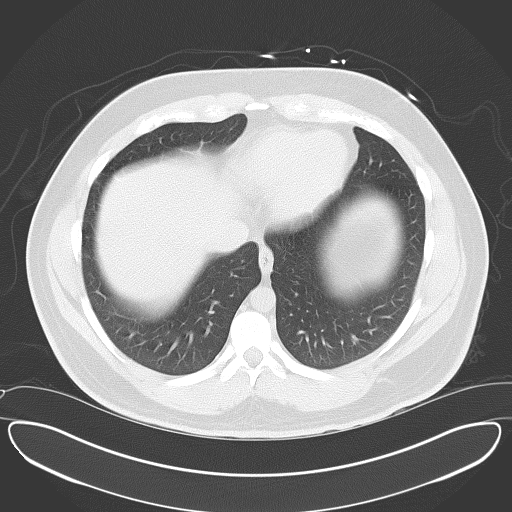
[im 18/23  soft-tissue]
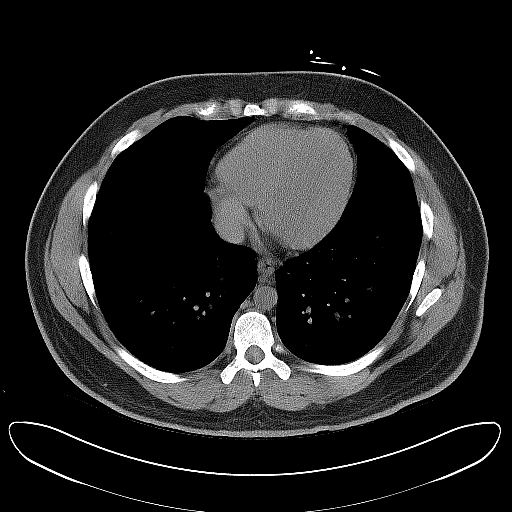
[im 18/23  lung]
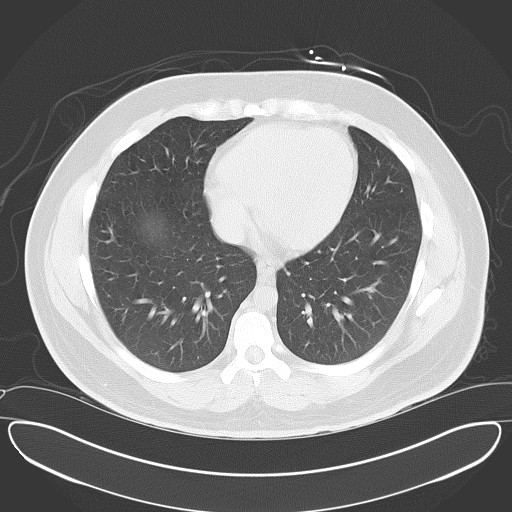

[4 of 32 positions shown; findings below may reference images not displayed]

FINDINGS: Lung bases are within normal.

Abdominal images demonstrate a normal liver, spleen, pancreas,
gallbladder and adrenal glands. Appendix is normal. Vascular
structures are within normal.

Kidneys are normal in size. There is a 5 mm stone over the mid pole
collecting system of the right kidney. There is minimal prominence
of the upper pole collecting system of the right kidney and right
renal pelvis. There is minimal prominence of the left renal
collecting system. The right ureter is normal. There is a 3 mm stone
at the left UVJ.

No free fluid or focal inflammatory change. Mesenteries within
normal. Colon small bowel are within normal.

Pelvic images demonstrate the prostate and rectum to be within
normal. No free fluid. Remaining bones and soft tissues are within
normal.
IMPRESSION: Right-sided nephrolithiasis. No left renal stones. 3 mm stone at the
left UVJ causing mild obstruction.

## 2019-02-03 LAB — TSH: TSH: 2.22 (ref <=5.90)

## 2019-07-30 ENCOUNTER — Ambulatory Visit: Payer: BC Managed Care – PPO | Attending: Internal Medicine

## 2019-07-30 DIAGNOSIS — Z23 Encounter for immunization: Secondary | ICD-10-CM | POA: Insufficient documentation

## 2019-07-30 NOTE — Progress Notes (Signed)
   Covid-19 Vaccination Clinic  Name:  Willie Randolph    MRN: [id number] DOB: [birth year]  07/30/2019  Willie Randolph was observed post Covid-19 immunization for 15 minutes without incidence. He was provided with Vaccine Information Sheet and instruction to access the V-Safe system.   Willie Randolph was instructed to call 911 with any severe reactions post vaccine: [name] Kitchen Difficulty breathing  . Swelling of your face and throat  . A fast heartbeat  . A bad rash all over your body  . Dizziness and weakness    Immunizations Administered    Name Date Dose VIS Date Route   Pfizer COVID-19 Vaccine 07/30/2019  3:31 PM 0.3 mL 05/12/2019 Intramuscular   Manufacturer: [employer], [employer]   Lot: [id number]   NDC: [address]-8

## 2019-08-26 ENCOUNTER — Ambulatory Visit: Payer: BC Managed Care – PPO

## 2019-08-30 ENCOUNTER — Ambulatory Visit: Payer: BC Managed Care – PPO | Attending: Internal Medicine

## 2019-08-30 DIAGNOSIS — Z23 Encounter for immunization: Secondary | ICD-10-CM

## 2019-08-30 NOTE — Progress Notes (Signed)
   Covid-19 Vaccination Clinic  Name:  Willie Randolph    MRN: [id number] DOB: 1990-06-26  08/30/2019  Mr. [name] was observed post Covid-19 immunization for 15 minutes without incident. He was provided with Vaccine Information Sheet and instruction to access the V-Safe system.   Mr. [name] was instructed to call 911 with any severe reactions post vaccine: [name] Kitchen Difficulty breathing  . Swelling of face and throat  . A fast heartbeat  . A bad rash all over body  . Dizziness and weakness   Immunizations Administered    Name Date Dose VIS Date Route   Pfizer COVID-19 Vaccine 08/30/2019  3:10 PM 0.3 mL 05/12/2019 Intramuscular   Manufacturer: [employer], [employer]   Lot: [id number]   NDC: [address]-9

## 2019-09-05 LAB — BASIC METABOLIC PANEL
BUN: 14 (ref 4–21)
Creatinine: 1.1 (ref 0.6–1.3)
Glucose: 97
Potassium: 4.6 (ref 3.4–5.3)
Sodium: 138 (ref 137–147)

## 2019-09-05 LAB — LIPID PANEL
Cholesterol: 162 (ref 0–200)
HDL: 31 — AB (ref 35–70)
LDL Cholesterol: 84
Triglycerides: 283 — AB (ref 40–160)

## 2019-09-05 LAB — HEPATIC FUNCTION PANEL: ALT: 28 (ref 10–40)

## 2019-09-05 LAB — COMPREHENSIVE METABOLIC PANEL: GFR calc non Af Amer: 92

## 2020-04-15 ENCOUNTER — Other Ambulatory Visit: Payer: Self-pay

## 2020-04-15 ENCOUNTER — Ambulatory Visit (INDEPENDENT_AMBULATORY_CARE_PROVIDER_SITE_OTHER): Payer: BC Managed Care – PPO | Admitting: Family Medicine

## 2020-04-15 ENCOUNTER — Encounter: Payer: Self-pay | Admitting: Family Medicine

## 2020-04-15 VITALS — BP 121/73 | HR 83 | Temp 97.7°F | Resp 16 | Ht 67.0 in | Wt 250.0 lb

## 2020-04-15 DIAGNOSIS — F331 Major depressive disorder, recurrent, moderate: Secondary | ICD-10-CM | POA: Insufficient documentation

## 2020-04-15 DIAGNOSIS — Z7689 Persons encountering health services in other specified circumstances: Secondary | ICD-10-CM | POA: Diagnosis not present

## 2020-04-15 DIAGNOSIS — F330 Major depressive disorder, recurrent, mild: Secondary | ICD-10-CM | POA: Diagnosis not present

## 2020-04-15 DIAGNOSIS — E785 Hyperlipidemia, unspecified: Secondary | ICD-10-CM

## 2020-04-15 DIAGNOSIS — F319 Bipolar disorder, unspecified: Secondary | ICD-10-CM | POA: Insufficient documentation

## 2020-04-15 DIAGNOSIS — F411 Generalized anxiety disorder: Secondary | ICD-10-CM

## 2020-04-15 DIAGNOSIS — E782 Mixed hyperlipidemia: Secondary | ICD-10-CM | POA: Insufficient documentation

## 2020-04-15 DIAGNOSIS — I10 Essential (primary) hypertension: Secondary | ICD-10-CM | POA: Diagnosis not present

## 2020-04-15 DIAGNOSIS — J3089 Other allergic rhinitis: Secondary | ICD-10-CM

## 2020-04-15 MED ORDER — ESCITALOPRAM OXALATE 20 MG PO TABS: 20.0000 mg | ORAL_TABLET | Freq: Every day | ORAL | 1 refills | Status: DC

## 2020-04-15 MED ORDER — MONTELUKAST SODIUM 10 MG PO TABS: 10.0000 mg | ORAL_TABLET | Freq: Every day | ORAL | 1 refills | Status: DC

## 2020-04-15 MED ORDER — LISINOPRIL 10 MG PO TABS: 10.0000 mg | ORAL_TABLET | Freq: Every day | ORAL | 1 refills | Status: DC

## 2020-04-15 MED ORDER — ATORVASTATIN CALCIUM 20 MG PO TABS: 20.0000 mg | ORAL_TABLET | Freq: Every day | ORAL | 1 refills | Status: DC

## 2020-04-15 MED ORDER — TRIAMTERENE-HCTZ 37.5-25 MG PO CAPS: 1.0000 | ORAL_CAPSULE | Freq: Every day | ORAL | 1 refills | Status: DC

## 2020-04-15 MED ORDER — ATENOLOL 25 MG PO TABS: 25.0000 mg | ORAL_TABLET | Freq: Every day | ORAL | 1 refills | Status: DC

## 2020-04-15 NOTE — Patient Instructions (Addendum)
Thank you for coming to the office today.  For the mood/anxiety - options are - 1 keep current dose Lexapro 10 - and monitor symptoms longer. May be underlying mood, anxiety, or inattention/focus or other issues. Can just be life stressors. - option 2) - double dose of Lexapro take 2 tab of the 10mg  instead of 1, and see if higher dose is effective  Option 3) we can consider add on new medication such as Wellbutrin or Buspar to help either mood or anxiety.  We chose option #2  New rx Lexapro generic 20mg  sent to pharmacy, try this - if you preferred the 10mg  in the future, cut the tab in half, and let me know. -----------------------------------------  Refilled all other meds.  May phase off Dicyclomine or use as needed only.  DUE for FASTING BLOOD WORK (no food or drink after midnight before the lab appointment, only water or coffee without cream/sugar on the morning of)  SCHEDULE "Lab Only" visit in the morning at the clinic for lab draw in 3 MONTHS   - Make sure Lab Only appointment is at about 1 week before your next appointment, so that results will be available  For Lab Results, once available within 2-3 days of blood draw, you can can log in to MyChart online to view your results and a brief explanation. Also, we can discuss results at next follow-up visit.   Please schedule a Follow-up Appointment to: Return in about 3 months (around 07/16/2020) for 3 month fasting lab only then 1 week later Annual Physical.  If you have any other questions or concerns, please feel free to call the office or send a message through MyChart. You may also schedule an earlier appointment if necessary.  Additionally, you may be receiving a survey about your experience at our office within a few days to 1 week by e-mail or mail. We value your feedback.  , DO [facility], 07/18/2020

## 2020-04-15 NOTE — Progress Notes (Signed)
Subjective:    Patient ID: Willie Randolph, male    DOB: 1990-12-09, 29 y.o.   MRN: [id number]  Willie Randolph is a 29 y.o. male presenting on 04/15/2020 for Establish Care and Hypertension  Previously relocated to [place] in 2015. Previous PCP Dr Carollee Massed in [place]  He teachers computer science at [employer]  HPI   Major Depression, recurrent chronic Generalized Anxiety GAD History of long term issue with mood depression episodic flares. He was told in past may have some sort of anxious or irritable ADHD-like symptoms, questioning if side effect of medication. Strong fam history of depression and mood symptoms, siblings, father, mother. - Has not seen Psychiatry or therapist in past. Only PCP - He is currently on Lexapro 10mg  daily - has not been on other doses - Previously on Celexa, had fatigue or tired then switched to Lexapro. - He says he focuses on things out of his control and causes some worries and stressors. He often is bringing   CHRONIC HTN: Morbid Obesity BMI >39 Reports chronic history of HTN, has been controlled on HTN. Current Meds - Atenolol 25mg  daily, Lisinopril 10mg  daily, Dyazide (triamterene-HCTZ) 37.5-25mg  daily   Reports good compliance, took meds today. Tolerating well, w/o complaints. Denies CP, dyspnea, HA, edema, dizziness / lightheadedness  HYPERLIPIDEMIA: - Reports no concerns. Last lipid panel about 1 year. No results available today - Currently taking Atorvastatin 20mg  daily, tolerating well without side effects or myalgias  GERD Taking Famotidine 20mg  BID  History of Kidney Stones History of x 3 stones in past. He had a 15mm stone in 2011 had a surgical removal of kidney stone at that time by Urology near [facility].   Health Maintenance: UTD COVID vaccine and booster UTD Flu vaccine  Depression screen [facility] 2/9 04/15/2020  Decreased Interest 1  Down, Depressed, Hopeless 1  PHQ - 2 Score 2  Altered sleeping 0    Tired, decreased energy 1  Change in appetite 1  Feeling bad or failure about yourself  1  Trouble concentrating 2  Moving slowly or fidgety/restless 0  PHQ-9 Score 7  Difficult doing work/chores Somewhat difficult   GAD 7 : Generalized Anxiety Score 04/15/2020  Nervous, Anxious, on Edge 1  Control/stop worrying 1  Worry too much - different things 2  Trouble relaxing 1  Restless 1  Easily annoyed or irritable 2  Afraid - awful might happen 0  Total GAD 7 Score 8  Anxiety Difficulty Somewhat difficult      Past Medical History:  Diagnosis Date  . Allergy   . Depression   . GERD (gastroesophageal reflux disease)   . Hypertension   . Kidney stones    Past Surgical History:  Procedure Laterality Date  . KIDNEY STONE SURGERY    . LITHOTRIPSY     Social History   Socioeconomic History  . Marital status: Single    Spouse name: Not on file  . Number of children: Not on file  . Years of education: Not on file  . Highest education level: Not on file  Occupational History  . Not on file  Tobacco Use  . Smoking status: Never Smoker  . Smokeless tobacco: Never Used  Substance and Sexual Activity  . Alcohol use: Yes    Comment: occ  . Drug use: No  . Sexual activity: Not on file  Other Topics Concern  . Not on file  Social History Narrative  . Not on file  Social Determinants of Health   Financial Resource Strain:   . Difficulty of Paying Living Expenses: Not on file  Food Insecurity:   . Worried About Programme researcher, broadcasting/film/video in the Last Year: Not on file  . Ran Out of Food in the Last Year: Not on file  Transportation Needs:   . Lack of Transportation (Medical): Not on file  . Lack of Transportation (Non-Medical): Not on file  Physical Activity:   . Days of Exercise per Week: Not on file  . Minutes of Exercise per Session: Not on file  Stress:   . Feeling of Stress : Not on file  Social Connections:   . Frequency of Communication with Friends and Family:  Not on file  . Frequency of Social Gatherings with Friends and Family: Not on file  . Attends Religious Services: Not on file  . Active Member of Clubs or Organizations: Not on file  . Attends Banker Meetings: Not on file  . Marital Status: Not on file  Intimate Partner Violence:   . Fear of Current or Ex-Partner: Not on file  . Emotionally Abused: Not on file  . Physically Abused: Not on file  . Sexually Abused: Not on file   Family History  Problem Relation Age of Onset  . Depression Mother   . Heart disease Father   . Diabetes Father   . Depression Father   . Depression Brother   . Heart attack Maternal Grandfather 83  . COPD Paternal Grandmother 67  . Heart attack Paternal Grandfather 74   Current Outpatient Medications on File Prior to Visit  Medication Sig  . dicyclomine (BENTYL) 10 MG capsule Take 10 mg by mouth 2 (two) times daily as needed.  . famotidine (PEPCID) 20 MG tablet Take 20 mg by mouth 2 (two) times daily.   No current facility-administered medications on file prior to visit.    Review of Systems Per HPI unless specifically indicated above     Objective:    BP 121/73   Pulse 83   Temp 97.7 F (36.5 C) (Temporal)   Resp 16   Ht 5\' 7"  (1.702 m)   Wt 250 lb (113.4 kg)   SpO2 100%   BMI 39.16 kg/m   Wt Readings from Last 3 Encounters:  04/15/20 250 lb (113.4 kg)  01/14/15 215 lb (97.5 kg)  01/14/15 215 lb (97.5 kg)    Physical Exam Vitals and nursing note reviewed.  Constitutional:      General: He is not in acute distress.    Appearance: He is well-developed. He is obese. He is not diaphoretic.     Comments: Well-appearing, comfortable, cooperative  HENT:     Head: Normocephalic and atraumatic.  Eyes:     General:        Right eye: No discharge.        Left eye: No discharge.     Conjunctiva/sclera: Conjunctivae normal.  Cardiovascular:     Rate and Rhythm: Normal rate.  Pulmonary:     Effort: Pulmonary effort is  normal.  Skin:    General: Skin is warm and dry.     Findings: No erythema or rash.  Neurological:     Mental Status: He is alert and oriented to person, place, and time.  Psychiatric:        Behavior: Behavior normal.     Comments: Well groomed, good eye contact, normal speech and thoughts       Results for  orders placed or performed during the hospital encounter of 01/15/15  CBC  Result Value Ref Range   WBC 12.0 (H) 3.8 - 10.6 K/uL   RBC 4.80 4.40 - 5.90 MIL/uL   Hemoglobin 14.3 13.0 - 18.0 g/dL   HCT 34.1 40 - 52 %   MCV 86.8 80.0 - 100.0 fL   MCH 29.8 26.0 - 34.0 pg   MCHC 34.3 32.0 - 36.0 g/dL   RDW 96.2 22.9 - 79.8 %   Platelets 245 150 - 440 K/uL  Comprehensive metabolic panel  Result Value Ref Range   Sodium 139 135 - 145 mmol/L   Potassium 4.2 3.5 - 5.1 mmol/L   Chloride 102 101 - 111 mmol/L   CO2 27 22 - 32 mmol/L   Glucose, Bld 120 (H) 65 - 99 mg/dL   BUN 20 6 - 20 mg/dL   Creatinine, Ser 9.21 (H) 0.61 - 1.24 mg/dL   Calcium 9.5 8.9 - 19.4 mg/dL   Total Protein 7.3 6.5 - 8.1 g/dL   Albumin 4.9 3.5 - 5.0 g/dL   AST 22 15 - 41 U/L   ALT 23 17 - 63 U/L   Alkaline Phosphatase 74 38 - 126 U/L   Total Bilirubin 0.7 0.3 - 1.2 mg/dL   GFR calc non Af Amer 59 (L) >60 mL/min   GFR calc Af Amer >60 >60 mL/min   Anion gap 10 5 - 15  Urinalysis complete, with microscopic ([facility] only)  Result Value Ref Range   Color, Urine YELLOW (A) YELLOW   APPearance CLEAR (A) CLEAR   Glucose, UA NEGATIVE NEGATIVE mg/dL   Bilirubin Urine NEGATIVE NEGATIVE   Ketones, ur NEGATIVE NEGATIVE mg/dL   Specific Gravity, Urine 1.015 1.005 - 1.030   Hgb urine dipstick 3+ (A) NEGATIVE   pH 6.0 5.0 - 8.0   Protein, ur NEGATIVE NEGATIVE mg/dL   Nitrite NEGATIVE NEGATIVE   Leukocytes, UA NEGATIVE NEGATIVE   RBC / HPF TOO NUMEROUS TO COUNT 0 - 5 RBC/hpf   WBC, UA 0-5 0 - 5 WBC/hpf   Bacteria, UA RARE (A) NONE SEEN   Squamous Epithelial / LPF 0-5 (A) NONE SEEN   Mucus PRESENT    Hyaline  Casts, UA PRESENT       Assessment & Plan:   Problem List Items Addressed This Visit    Morbid obesity (HCC)    BMI >39 with comorbid factors major depression, HTN, GERD Encourage lifestyle diet exercise.      Major depressive disorder, recurrent, mild (HCC) - Primary   Relevant Medications   escitalopram (LEXAPRO) 20 MG tablet   Hyperlipidemia   Relevant Medications   atenolol (TENORMIN) 25 MG tablet   atorvastatin (LIPITOR) 20 MG tablet   lisinopril (ZESTRIL) 10 MG tablet   triamterene-hydrochlorothiazide (DYAZIDE) 37.5-25 MG capsule   GAD (generalized anxiety disorder)   Relevant Medications   escitalopram (LEXAPRO) 20 MG tablet   Essential hypertension   Relevant Medications   atenolol (TENORMIN) 25 MG tablet   atorvastatin (LIPITOR) 20 MG tablet   lisinopril (ZESTRIL) 10 MG tablet   triamterene-hydrochlorothiazide (DYAZIDE) 37.5-25 MG capsule   Environmental and seasonal allergies   Relevant Medications   montelukast (SINGULAIR) 10 MG tablet    Other Visit Diagnoses    Encounter to establish care with new doctor          Request outside records from prior PCP Dr Lyn Hollingshead Sandre Kitty.  #HLD Previously controlled, last lab >1 year Re order Atorvastatin 20mg   daily Request record, check labs in 3 months  #HTN Controlled on current regimen Continue / refill - Atenolol 25mg  daily, Lisinopril 10mg  daily, Triamterene-HCTZ 37.5-25mg  daily  #Allergies Refill Singulair  #Major depression, recurrent mild / GAD Anxiety Chronic problem, seems has some breakthrough symptoms question mood vs anxiety Not followed by Psychiatry or therapy Will increase Lexapro from 10 to 20 now and follow up as planned, sooner if need, consider add on therapy Wellbutrin vs Buspar Future consider counseling/therapy  Meds ordered this encounter  Medications  . atenolol (TENORMIN) 25 MG tablet    Sig: Take 1 tablet (25 mg total) by mouth daily.    Dispense:  90 tablet    Refill:  1    . atorvastatin (LIPITOR) 20 MG tablet    Sig: Take 1 tablet (20 mg total) by mouth daily.    Dispense:  90 tablet    Refill:  1  . escitalopram (LEXAPRO) 20 MG tablet    Sig: Take 1 tablet (20 mg total) by mouth daily.    Dispense:  90 tablet    Refill:  1    Dose increase from 10 to 20  . lisinopril (ZESTRIL) 10 MG tablet    Sig: Take 1 tablet (10 mg total) by mouth daily.    Dispense:  90 tablet    Refill:  1  . montelukast (SINGULAIR) 10 MG tablet    Sig: Take 1 tablet (10 mg total) by mouth at bedtime.    Dispense:  90 tablet    Refill:  1  . triamterene-hydrochlorothiazide (DYAZIDE) 37.5-25 MG capsule    Sig: Take 1 each (1 capsule total) by mouth daily.    Dispense:  90 capsule    Refill:  1      Follow up plan: Return in about 3 months (around 07/16/2020) for 3 month fasting lab only then 1 week later Annual Physical.  Saralyn PilarAlexander Iver Miklas, DO [facility] 04/15/2020, 3:46 PM

## 2020-04-15 NOTE — Assessment & Plan Note (Signed)
BMI >39 with comorbid factors major depression, HTN, GERD Encourage lifestyle diet exercise.

## 2020-05-07 ENCOUNTER — Encounter: Payer: Self-pay | Admitting: Family Medicine

## 2020-07-14 ENCOUNTER — Other Ambulatory Visit: Payer: Self-pay | Admitting: Family Medicine

## 2020-07-14 DIAGNOSIS — F411 Generalized anxiety disorder: Secondary | ICD-10-CM

## 2020-07-14 DIAGNOSIS — J3089 Other allergic rhinitis: Secondary | ICD-10-CM

## 2020-07-14 DIAGNOSIS — E785 Hyperlipidemia, unspecified: Secondary | ICD-10-CM

## 2020-07-14 DIAGNOSIS — I10 Essential (primary) hypertension: Secondary | ICD-10-CM

## 2020-07-14 DIAGNOSIS — F330 Major depressive disorder, recurrent, mild: Secondary | ICD-10-CM

## 2020-07-22 ENCOUNTER — Other Ambulatory Visit: Payer: Self-pay

## 2020-07-22 ENCOUNTER — Encounter: Payer: BC Managed Care – PPO | Admitting: Family Medicine

## 2020-07-22 DIAGNOSIS — I10 Essential (primary) hypertension: Secondary | ICD-10-CM

## 2020-07-22 DIAGNOSIS — E785 Hyperlipidemia, unspecified: Secondary | ICD-10-CM

## 2020-07-22 DIAGNOSIS — Z0000 Encounter for general adult medical examination without abnormal findings: Secondary | ICD-10-CM

## 2020-07-22 LAB — CBC WITH DIFFERENTIAL/PLATELET
Absolute Monocytes: 450 cells/uL (ref 200–950)
Basophils Relative: 0.9 %
Lymphs Abs: 2052 cells/uL (ref 850–3900)
MCV: 87.6 fL (ref 80.0–100.0)
Monocytes Relative: 7.9 %
Neutro Abs: 3118 cells/uL (ref 1500–7800)
Platelets: 283 10*3/uL (ref 140–400)

## 2020-07-22 LAB — COMPREHENSIVE METABOLIC PANEL: Glucose, Bld: 91 mg/dL (ref 65–99)

## 2020-07-23 LAB — CBC WITH DIFFERENTIAL/PLATELET
Basophils Absolute: 51 cells/uL (ref 0–200)
Eosinophils Absolute: 29 cells/uL (ref 15–500)
Eosinophils Relative: 0.5 %
HCT: 41.1 % (ref 38.5–50.0)
Hemoglobin: 14.2 g/dL (ref 13.2–17.1)
MCH: 30.3 pg (ref 27.0–33.0)
MCHC: 34.5 g/dL (ref 32.0–36.0)
MPV: 11.2 fL (ref 7.5–12.5)
Neutrophils Relative %: 54.7 %
RBC: 4.69 10*6/uL (ref 4.20–5.80)
RDW: 12.2 % (ref 11.0–15.0)
Total Lymphocyte: 36 %
WBC: 5.7 10*3/uL (ref 3.8–10.8)

## 2020-07-23 LAB — LIPID PANEL
Cholesterol: 150 mg/dL (ref <200)
HDL: 35 mg/dL — ABNORMAL LOW (ref >=40)
LDL Cholesterol (Calc): 82 mg/dL (calc)
Non-HDL Cholesterol (Calc): 115 mg/dL (calc) (ref <130)
Total CHOL/HDL Ratio: 4.3 (calc) (ref <5.0)
Triglycerides: 238 mg/dL — ABNORMAL HIGH (ref <150)

## 2020-07-23 LAB — HEMOGLOBIN A1C
Hgb A1c MFr Bld: 5.4 % of total Hgb (ref <5.7)
Mean Plasma Glucose: 108 mg/dL
eAG (mmol/L): 6 mmol/L

## 2020-07-23 LAB — COMPREHENSIVE METABOLIC PANEL
AG Ratio: 2 (calc) (ref 1.0–2.5)
ALT: 18 U/L (ref 9–46)
AST: 12 U/L (ref 10–40)
Albumin: 4.5 g/dL (ref 3.6–5.1)
Alkaline phosphatase (APISO): 96 U/L (ref 36–130)
BUN: 14 mg/dL (ref 7–25)
CO2: 25 mmol/L (ref 20–32)
Calcium: 9.6 mg/dL (ref 8.6–10.3)
Chloride: 104 mmol/L (ref 98–110)
Creat: 1.02 mg/dL (ref 0.60–1.35)
Globulin: 2.3 g/dL (calc) (ref 1.9–3.7)
Potassium: 4.3 mmol/L (ref 3.5–5.3)
Sodium: 139 mmol/L (ref 135–146)
Total Bilirubin: 0.3 mg/dL (ref 0.2–1.2)
Total Protein: 6.8 g/dL (ref 6.1–8.1)

## 2020-07-26 ENCOUNTER — Ambulatory Visit (INDEPENDENT_AMBULATORY_CARE_PROVIDER_SITE_OTHER): Payer: BC Managed Care – PPO | Admitting: Family Medicine

## 2020-07-26 ENCOUNTER — Other Ambulatory Visit: Payer: Self-pay

## 2020-07-26 ENCOUNTER — Other Ambulatory Visit: Payer: Self-pay | Admitting: Family Medicine

## 2020-07-26 ENCOUNTER — Encounter: Payer: Self-pay | Admitting: Family Medicine

## 2020-07-26 VITALS — BP 126/78 | HR 79 | Ht 69.0 in | Wt 254.4 lb

## 2020-07-26 DIAGNOSIS — I10 Essential (primary) hypertension: Secondary | ICD-10-CM

## 2020-07-26 DIAGNOSIS — F330 Major depressive disorder, recurrent, mild: Secondary | ICD-10-CM | POA: Diagnosis not present

## 2020-07-26 DIAGNOSIS — F411 Generalized anxiety disorder: Secondary | ICD-10-CM

## 2020-07-26 DIAGNOSIS — Z0000 Encounter for general adult medical examination without abnormal findings: Secondary | ICD-10-CM

## 2020-07-26 DIAGNOSIS — E785 Hyperlipidemia, unspecified: Secondary | ICD-10-CM

## 2020-07-26 DIAGNOSIS — M76899 Other specified enthesopathies of unspecified lower limb, excluding foot: Secondary | ICD-10-CM

## 2020-07-26 DIAGNOSIS — E782 Mixed hyperlipidemia: Secondary | ICD-10-CM

## 2020-07-26 DIAGNOSIS — Z23 Encounter for immunization: Secondary | ICD-10-CM | POA: Diagnosis not present

## 2020-07-26 NOTE — Assessment & Plan Note (Signed)
Major depression, recurrent mild / GAD Anxiety Not followed by Psychiatry or therapy Continue Escitalopram (Lexapro) 20mg  daily, improved results Future consider other med option Future consider counseling/therapy

## 2020-07-26 NOTE — Assessment & Plan Note (Signed)
BMI >37 with comorbid factors major depression, HTN, GERD Encourage lifestyle diet exercise.

## 2020-07-26 NOTE — Progress Notes (Signed)
Subjective:    Patient ID: Willie Randolph, male    DOB: 02/06/1991, 30 y.o.   MRN: [id number]  Willie Randolph is a 30 y.o. male presenting on 07/26/2020 for Annual Exam and Depression   HPI   Here for Annual Physical and Lab Review.  Major Depression, recurrent chronic Generalized Anxiety GAD History of long term issue with mood depression episodic flares. He was told in past may have some sort of anxious or irritable ADHD-like symptoms, questioning if side effect of medication. Strong fam history of depression and mood symptoms, siblings, father, mother. - Has not seen Psychiatry or therapist in past. Only PCP - He is currently on Lexapro 20mg  daily he is doing well. See scores below. - Previously on Celexa  He has goal to be more present in future to focus more on home life and family, after work and not bring work home with him.  CHRONIC HTN: Morbid Obesity BMI >37 Reports chronic history of HTN, has been controlled on HTN. Current Meds: -  Atenolol 25mg  daily, Lisinopril 10mg  daily, Dyazide (triamterene-HCTZ) 37.5-25mg  daily   Reports good compliance, took meds today. Tolerating well, w/o complaints. Denies CP, dyspnea, HA, edema, dizziness / lightheadedness  HYPERLIPIDEMIA: - Reports no concerns. Last lipid panel 07/2020, controlled LDL 80s, TG >230. - Currently taking Atorvastatin 20mg  daily, tolerating well without side effects or myalgias - Not taking Omega 3 fish oil but may try.  GERD Taking Famotidine 20mg  BID  History of Kidney Stones History of x 3 stones in past. He had a 61mm stone in 2011 had a surgical removal of kidney stone at that time by Urology near [facility].   Additional concerns  Left Knee Sore Area of top of knee, quad tendon. Reports few weeks, some days better than others, no known injury or twisting. Some swelling.   Health Maintenance: UTD COVID vaccine and booster UTD Flu vaccine  TDap due today, will receive  Declines routine Hep  C and HIV.   Depression screen [facility] 2/9 07/26/2020 04/15/2020  Decreased Interest 1 1  Down, Depressed, Hopeless 1 1  PHQ - 2 Score 2 2  Altered sleeping 0 0  Tired, decreased energy 1 1  Change in appetite 1 1  Feeling bad or failure about yourself  1 1  Trouble concentrating 0 2  Moving slowly or fidgety/restless 0 0  PHQ-9 Score 5 7  Difficult doing work/chores Not difficult at all Somewhat difficult    GAD 7 : Generalized Anxiety Score 07/26/2020 04/15/2020  Nervous, Anxious, on Edge 3 1  Control/stop worrying 0 1  Worry too much - different things 1 2  Trouble relaxing 2 1  Restless 0 1  Easily annoyed or irritable 0 2  Afraid - awful might happen 0 0  Total GAD 7 Score 6 8  Anxiety Difficulty Very difficult Somewhat difficult      Past Medical History:  Diagnosis Date  . Allergy   . Depression   . GERD (gastroesophageal reflux disease)   . Hypertension   . Kidney stones    Past Surgical History:  Procedure Laterality Date  . KIDNEY STONE SURGERY    . LITHOTRIPSY     Social History   Socioeconomic History  . Marital status: Married    Spouse name: Not on file  . Number of children: Not on file  . Years of education: Not on file  . Highest education level: Not on file  Occupational History  . Not on file  Tobacco Use  . Smoking status: Never Smoker  . Smokeless tobacco: Never Used  Substance and Sexual Activity  . Alcohol use: Yes    Comment: occ  . Drug use: No  . Sexual activity: Not on file  Other Topics Concern  . Not on file  Social History Narrative  . Not on file   Social Determinants of Health   Financial Resource Strain: Not on file  Food Insecurity: Not on file  Transportation Needs: Not on file  Physical Activity: Not on file  Stress: Not on file  Social Connections: Not on file  Intimate Partner Violence: Not on file   Family History  Problem Relation Age of Onset  . Depression Mother   . Heart disease Father   . Diabetes  Father   . Depression Father   . Depression Brother   . Heart attack Maternal Grandfather 83  . COPD Paternal Grandmother 29  . Heart attack Paternal Grandfather 2   Current Outpatient Medications on File Prior to Visit  Medication Sig  . aspirin EC 81 MG tablet Take 1 tablet (81 mg total) by mouth daily.  [name] Kitchen atenolol (TENORMIN) 25 MG tablet Take 1 tablet (25 mg total) by mouth daily.  [name] Kitchen atorvastatin (LIPITOR) 20 MG tablet TAKE 1 TABLET BY MOUTH ONCE DAILY  . dicyclomine (BENTYL) 10 MG capsule Take 10 mg by mouth 2 (two) times daily as needed.  [name] Kitchen escitalopram (LEXAPRO) 20 MG tablet TAKE 1 TABLET BY MOUTH ONCE DAILY  . famotidine (PEPCID) 20 MG tablet Take 20 mg by mouth 2 (two) times daily.  [name] Kitchen lisinopril (ZESTRIL) 10 MG tablet TAKE 1 TABLET BY MOUTH ONCE DAILY  . montelukast (SINGULAIR) 10 MG tablet TAKE 1 TABLET BY MOUTH AT BEDTIME  . triamterene-hydrochlorothiazide (DYAZIDE) 37.5-25 MG capsule Take 1 each (1 capsule total) by mouth daily.   No current facility-administered medications on file prior to visit.    Review of Systems  Constitutional: Negative for activity change, appetite change, chills, diaphoresis, fatigue and fever.  HENT: Negative for congestion and hearing loss.   Eyes: Negative for visual disturbance.  Respiratory: Negative for cough, chest tightness, shortness of breath and wheezing.   Cardiovascular: Negative for chest pain, palpitations and leg swelling.  Gastrointestinal: Negative for abdominal pain, constipation, diarrhea, nausea and vomiting.  Genitourinary: Negative for dysuria, frequency and hematuria.  Musculoskeletal: Positive for arthralgias (left knee). Negative for neck pain.  Skin: Negative for rash.  Allergic/Immunologic: Negative for environmental allergies.  Neurological: Negative for dizziness, weakness, light-headedness, numbness and headaches.  Hematological: Negative for adenopathy.  Psychiatric/Behavioral: Negative for behavioral problems,  dysphoric mood and sleep disturbance.   Per HPI unless specifically indicated above     Objective:    BP 126/78   Pulse 79   Ht 5\' 9"  (1.753 m)   Wt 254 lb 6.4 oz (115.4 kg)   SpO2 100%   BMI 37.57 kg/m   Wt Readings from Last 3 Encounters:  07/26/20 254 lb 6.4 oz (115.4 kg)  04/15/20 250 lb (113.4 kg)  01/14/15 215 lb (97.5 kg)    Physical Exam Vitals and nursing note reviewed.  Constitutional:      General: He is not in acute distress.    Appearance: He is well-developed and well-nourished. He is obese. He is not diaphoretic.     Comments: Well-appearing, comfortable, cooperative  HENT:     Head: Normocephalic and atraumatic.     Mouth/Throat:     Mouth: Oropharynx is clear and  moist.  Eyes:     General:        Right eye: No discharge.        Left eye: No discharge.     Extraocular Movements: EOM normal.     Conjunctiva/sclera: Conjunctivae normal.     Pupils: Pupils are equal, round, and reactive to light.  Neck:     Thyroid: No thyromegaly.     Vascular: No carotid bruit.  Cardiovascular:     Rate and Rhythm: Normal rate and regular rhythm.     Pulses: Intact distal pulses.     Heart sounds: Normal heart sounds. No murmur heard.   Pulmonary:     Effort: Pulmonary effort is normal. No respiratory distress.     Breath sounds: Normal breath sounds. No wheezing or rales.  Abdominal:     General: Bowel sounds are normal. There is no distension.     Palpations: Abdomen is soft. There is no mass.     Tenderness: There is no abdominal tenderness.  Musculoskeletal:        General: No tenderness or edema. Normal range of motion.     Cervical back: Normal range of motion and neck supple.     Right lower leg: No edema.     Left lower leg: No edema.     Comments: Upper / Lower Extremities: - Normal muscle tone, strength bilateral upper extremities 5/5, lower extremities 5/5  LEFT Knee Inspection: Normal appearance and symmetrical. No ecchymosis or  effusion. Palpation: Mild tender to palpation Quad tendon L side only. Joint line non tender. No crepitus ROM: Full active ROM bilaterally Special Testing: Lachman / Valgus/Varus tests negative with intact ligaments (ACL, MCL, LCL). Standing Thessaly negative Strength: 5/5 intact knee flex/ext, ankle dorsi/plantarflex Neurovascular: distally intact sensation light touch and pulses   Lymphadenopathy:     Cervical: No cervical adenopathy.  Skin:    General: Skin is warm and dry.     Findings: No erythema or rash.  Neurological:     Mental Status: He is alert and oriented to person, place, and time.     Comments: Distal sensation intact to light touch all extremities  Psychiatric:        Mood and Affect: Mood and affect normal.        Behavior: Behavior normal.     Comments: Well groomed, good eye contact, normal speech and thoughts        Results for orders placed or performed in visit on 07/22/20  Comprehensive metabolic panel  Result Value Ref Range   Glucose, Bld 91 65 - 99 mg/dL   BUN 14 7 - 25 mg/dL   Creat 1.611.02 0.960.60 - 0.451.35 mg/dL   BUN/Creatinine Ratio NOT APPLICABLE 6 - 22 (calc)   Sodium 139 135 - 146 mmol/L   Potassium 4.3 3.5 - 5.3 mmol/L   Chloride 104 98 - 110 mmol/L   CO2 25 20 - 32 mmol/L   Calcium 9.6 8.6 - 10.3 mg/dL   Total Protein 6.8 6.1 - 8.1 g/dL   Albumin 4.5 3.6 - 5.1 g/dL   Globulin 2.3 1.9 - 3.7 g/dL (calc)   AG Ratio 2.0 1.0 - 2.5 (calc)   Total Bilirubin 0.3 0.2 - 1.2 mg/dL   Alkaline phosphatase (APISO) 96 36 - 130 U/L   AST 12 10 - 40 U/L   ALT 18 9 - 46 U/L  Hemoglobin A1c  Result Value Ref Range   Hgb A1c MFr Bld 5.4 <5.7 % of  total Hgb   Mean Plasma Glucose 108 mg/dL   eAG (mmol/L) 6.0 mmol/L  CBC with Differential/Platelet  Result Value Ref Range   WBC 5.7 3.8 - 10.8 Thousand/uL   RBC 4.69 4.20 - 5.80 Million/uL   Hemoglobin 14.2 13.2 - 17.1 g/dL   HCT 16.1 09.6 - 04.5 %   MCV 87.6 80.0 - 100.0 fL   MCH 30.3 27.0 - 33.0 pg   MCHC  34.5 32.0 - 36.0 g/dL   RDW 40.9 81.1 - 91.4 %   Platelets 283 140 - 400 Thousand/uL   MPV 11.2 7.5 - 12.5 fL   Neutro Abs 3,118 1,500 - 7,800 cells/uL   Lymphs Abs 2,052 850 - 3,900 cells/uL   Absolute Monocytes 450 200 - 950 cells/uL   Eosinophils Absolute 29 15 - 500 cells/uL   Basophils Absolute 51 0 - 200 cells/uL   Neutrophils Relative % 54.7 %   Total Lymphocyte 36.0 %   Monocytes Relative 7.9 %   Eosinophils Relative 0.5 %   Basophils Relative 0.9 %  Lipid panel  Result Value Ref Range   Cholesterol 150 <200 mg/dL   HDL 35 (L) > OR = 40 mg/dL   Triglycerides 782 (H) <150 mg/dL   LDL Cholesterol (Calc) 82 mg/dL (calc)   Total CHOL/HDL Ratio 4.3 <5.0 (calc)   Non-HDL Cholesterol (Calc) 115 <130 mg/dL (calc)      Assessment & Plan:   Problem List Items Addressed This Visit    Morbid obesity (HCC)    BMI >37 with comorbid factors major depression, HTN, GERD Encourage lifestyle diet exercise.      Mixed hyperlipidemia    Controlled cholesterol on statin and lifestyle Elevated TG however Last lipid 07/2020  Plan: 1. Continue current meds - Atorvastatin  daily Add Fish Oil omega 3, 1,000 to 2,000 daily for TG 2. Encourage improved lifestyle - low carb/cholesterol, reduce portion size, continue improving regular exercise      Major depressive disorder, recurrent, mild (HCC)    Major depression, recurrent mild / GAD Anxiety Not followed by Psychiatry or therapy Continue Escitalopram (Lexapro)  daily, improved results Future consider other med option Future consider counseling/therapy      GAD (generalized anxiety disorder)    See A&P GAD      Essential hypertension    Well-controlled HTN - Home BP readings limited results, can check in future  No known complications    Plan:  1. Continue current BP regimen -  Atenolol  daily, Lisinopril  daily, Dyazide (triamterene-HCTZ) 37.5-25mg  daily 2. Encourage improved lifestyle - low sodium diet,  regular exercise 3. Continue monitor BP outside office, bring readings to next visit, if persistently >140/90 or new symptoms notify office sooner       Other Visit Diagnoses    Annual physical exam    -  Primary   Need for diphtheria-tetanus-pertussis (Tdap) vaccine       Relevant Orders   Tdap vaccine greater than or equal to 7yo IM (Completed)   Quadriceps tendonitis          Updated Health Maintenance information Due Tdap today, will receive Declines Hep C and HIV routine screen UTD Other vaccines Reviewed recent lab results with patient Encouraged improvement to lifestyle with diet and exercise - Goal of weight loss  L knee quad tendonitis Likely etiology given localized symptoms, for past few weeks No other abnormality on knee exam or history No edema today Reassurance RICE therapy, Knee sleeve compression if need  Can use topicals PRN Relative rest  No orders of the defined types were placed in this encounter.   Follow up plan: Return in about 1 year (around 07/26/2021) for 1 year fasting lab only then 1 week later Annual Physical.  Future labs ordered 07/2021  Saralyn Pilar, DO [facility] 07/26/2020, 2:49 PM

## 2020-07-26 NOTE — Assessment & Plan Note (Signed)
(

## 2020-07-26 NOTE — Assessment & Plan Note (Signed)
Well-controlled HTN - Home BP readings limited results, can check in future  No known complications    Plan:  1. Continue current BP regimen -  Atenolol 25mg  daily, Lisinopril 10mg  daily, Dyazide (triamterene-HCTZ) 37.5-25mg  daily 2. Encourage improved lifestyle - low sodium diet, regular exercise 3. Continue monitor BP outside office, bring readings to next visit, if persistently >140/90 or new symptoms notify office sooner

## 2020-07-26 NOTE — Patient Instructions (Addendum)
Thank you for coming to the office today.  Try the Fish Oil Omega 3 1000mg  capsules minimum dose is 1 a day with a meal, can go up to 2 a day if preferred. Absolute max dose is 4 a day, 2 twice a day.  Total and LDL cholesterol are excellent, controlled on Atorvastatin.  Recent Labs    07/22/20 0803  HGBA1C 5.4   Excellent sugar result, not in range of pre diabetes.  TDap vaccine today, good for 10 years, includes pertussis protection for whooping cough.  DUE for FASTING BLOOD WORK (no food or drink after midnight before the lab appointment, only water or coffee without cream/sugar on the morning of)  SCHEDULE "Lab Only" visit in the morning at the clinic for lab draw in 1 YEAR  - Make sure Lab Only appointment is at about 1 week before your next appointment, so that results will be available  For Lab Results, once available within 2-3 days of blood draw, you can can log in to MyChart online to view your results and a brief explanation. Also, we can discuss results at next follow-up visit.   Please schedule a Follow-up Appointment to: Return in about 1 year (around 07/26/2021) for 1 year fasting lab only then 1 week later Annual Physical.  If you have any other questions or concerns, please feel free to call the office or send a message through MyChart. You may also schedule an earlier appointment if necessary.  Additionally, you may be receiving a survey about your experience at our office within a few days to 1 week by e-mail or mail. We value your feedback.  07/28/2021, DO [facility], [facility]

## 2020-07-26 NOTE — Assessment & Plan Note (Signed)
Controlled cholesterol on statin and lifestyle Elevated TG however Last lipid 07/2020  Plan: 1. Continue current meds - Atorvastatin 20mg  daily Add Fish Oil omega 3, 1,000 to 2,000 daily for TG 2. Encourage improved lifestyle - low carb/cholesterol, reduce portion size, continue improving regular exercise

## 2021-01-13 ENCOUNTER — Other Ambulatory Visit: Payer: Self-pay | Admitting: Family Medicine

## 2021-01-13 DIAGNOSIS — E785 Hyperlipidemia, unspecified: Secondary | ICD-10-CM

## 2021-01-13 DIAGNOSIS — J3089 Other allergic rhinitis: Secondary | ICD-10-CM

## 2021-01-13 DIAGNOSIS — F330 Major depressive disorder, recurrent, mild: Secondary | ICD-10-CM

## 2021-01-13 DIAGNOSIS — I10 Essential (primary) hypertension: Secondary | ICD-10-CM

## 2021-01-13 DIAGNOSIS — F411 Generalized anxiety disorder: Secondary | ICD-10-CM

## 2021-01-13 NOTE — Telephone Encounter (Signed)
Future OV 07/28/21. Approved per protocol.  Requested Prescriptions  Pending Prescriptions Disp Refills  . atorvastatin (LIPITOR) 20 MG tablet [Pharmacy Med Name: ATORVASTATIN CALCIUM 20 MG TAB] 90 tablet 1    Sig: TAKE 1 TABLET BY MOUTH ONCE DAILY     Cardiovascular:  Antilipid - Statins Failed - 01/13/2021  1:01 PM      Failed - HDL in normal range and within 360 days    HDL  Date Value Ref Range Status  07/22/2020 35 (L) > OR = 40 mg/dL Final         Failed - Triglycerides in normal range and within 360 days    Triglycerides  Date Value Ref Range Status  07/22/2020 238 (H) <150 mg/dL Final    Comment:    . If a non-fasting specimen was collected, consider repeat triglyceride testing on a fasting specimen if clinically indicated.  Perry Mount et al. J. of Clin. Lipidol. 2015;9:129-169. [name] Kitchen          Passed - Total Cholesterol in normal range and within 360 days    Cholesterol  Date Value Ref Range Status  07/22/2020 150 <200 mg/dL Final         Passed - LDL in normal range and within 360 days    LDL Cholesterol (Calc)  Date Value Ref Range Status  07/22/2020 82 mg/dL (calc) Final    Comment:    Reference range: <100 . Desirable range <100 mg/dL for primary prevention;   <70 mg/dL for patients with CHD or diabetic patients  with > or = 2 CHD risk factors. [name] Kitchen LDL-C is now calculated using the Martin-Hopkins  calculation, which is a validated novel method providing  better accuracy than the Friedewald equation in the  estimation of LDL-C.  Horald Pollen et al. Lenox Ahr. 7169;678(93): 2061-2068  (http://education.[facility].com/faq/FAQ164)          Passed - Patient is not pregnant      Passed - Valid encounter within last 12 months    Recent Outpatient Visits          5 months ago Annual physical exam   [facility], Netta Neat, DO   9 months ago Major depressive disorder, recurrent, mild (HCC)   [facility] Althea Charon,  Netta Neat, DO      Future Appointments            In 6 months Althea Charon, Netta Neat, DO [facility], [facility]           . montelukast (SINGULAIR) 10 MG tablet [Pharmacy Med Name: MONTELUKAST SODIUM 10 MG TAB] 90 tablet 1    Sig: TAKE 1 TABLET BY MOUTH AT BEDTIME     Pulmonology:  Leukotriene Inhibitors Passed - 01/13/2021  1:01 PM      Passed - Valid encounter within last 12 months    Recent Outpatient Visits          5 months ago Annual physical exam   [facility] [place], Netta Neat, DO   9 months ago Major depressive disorder, recurrent, mild (HCC)   [facility] Althea Charon, Netta Neat, DO      Future Appointments            In 6 months Althea Charon, Netta Neat, DO [facility], [facility]

## 2021-01-13 NOTE — Telephone Encounter (Signed)
Future OV 07/28/21. Approved per protocol.  Requested Prescriptions  Pending Prescriptions Disp Refills  . escitalopram (LEXAPRO) 20 MG tablet [Pharmacy Med Name: ESCITALOPRAM OXALATE 20 MG TAB] 90 tablet 1    Sig: TAKE 1 TABLET BY MOUTH ONCE DAILY     Psychiatry:  Antidepressants - SSRI Passed - 01/13/2021 12:38 PM      Passed - Completed PHQ-2 or PHQ-9 in the last 360 days      Passed - Valid encounter within last 6 months    Recent Outpatient Visits          5 months ago Annual physical exam   [facility] Smitty Cords, DO   9 months ago Major depressive disorder, recurrent, mild (HCC)   [facility] Althea Charon, Netta Neat, DO      Future Appointments            In 6 months Althea Charon, Netta Neat, DO [facility], [facility]           . lisinopril (ZESTRIL) 10 MG tablet [Pharmacy Med Name: LISINOPRIL 10 MG TAB] 90 tablet 1    Sig: TAKE 1 TABLET BY MOUTH ONCE DAILY     Cardiovascular:  ACE Inhibitors Passed - 01/13/2021 12:38 PM      Passed - Cr in normal range and within 180 days    Creat  Date Value Ref Range Status  07/22/2020 1.02 0.60 - 1.35 mg/dL Final         Passed - K in normal range and within 180 days    Potassium  Date Value Ref Range Status  07/22/2020 4.3 3.5 - 5.3 mmol/L Final         Passed - Patient is not pregnant      Passed - Last BP in normal range    BP Readings from Last 1 Encounters:  07/26/20 126/78         Passed - Valid encounter within last 6 months    Recent Outpatient Visits          5 months ago Annual physical exam   [facility] Smitty Cords, DO   9 months ago Major depressive disorder, recurrent, mild (HCC)   [facility] Althea Charon, Netta Neat, DO      Future Appointments            In 6 months Althea Charon, Netta Neat, DO [facility], [facility]

## 2021-01-15 ENCOUNTER — Other Ambulatory Visit: Payer: Self-pay

## 2021-01-15 DIAGNOSIS — I10 Essential (primary) hypertension: Secondary | ICD-10-CM

## 2021-01-15 MED ORDER — ATENOLOL 25 MG PO TABS: 25.0000 mg | ORAL_TABLET | Freq: Every day | ORAL | 1 refills | Status: DC

## 2021-04-25 ENCOUNTER — Other Ambulatory Visit: Payer: Self-pay | Admitting: Family Medicine

## 2021-04-25 DIAGNOSIS — I10 Essential (primary) hypertension: Secondary | ICD-10-CM

## 2021-04-26 NOTE — Telephone Encounter (Signed)
last RF 01/15/21 #90 1 RF too soon

## 2021-04-30 ENCOUNTER — Other Ambulatory Visit: Payer: Self-pay

## 2021-07-25 ENCOUNTER — Other Ambulatory Visit: Payer: Self-pay | Admitting: Family Medicine

## 2021-07-25 DIAGNOSIS — F411 Generalized anxiety disorder: Secondary | ICD-10-CM

## 2021-07-25 DIAGNOSIS — F330 Major depressive disorder, recurrent, mild: Secondary | ICD-10-CM

## 2021-07-25 NOTE — Telephone Encounter (Signed)
Appointment 09/08/21- courtesy RF #90 given Requested Prescriptions  Pending Prescriptions Disp Refills   escitalopram (LEXAPRO) 20 MG tablet [Pharmacy Med Name: ESCITALOPRAM OXALATE 20 MG TAB] 90 tablet 1    Sig: TAKE 1 TABLET BY MOUTH ONCE DAILY     Psychiatry:  Antidepressants - SSRI Failed - 07/25/2021  9:00 AM      Failed - Completed PHQ-2 or PHQ-9 in the last 360 days      Failed - Valid encounter within last 6 months    Recent Outpatient Visits          12 months ago Annual physical exam   [facility] Smitty Cords, DO   1 year ago Major depressive disorder, recurrent, mild (HCC)   [facility] Althea Charon, Netta Neat, DO      Future Appointments            In 1 month Althea Charon, Netta Neat, DO [facility], [facility]

## 2021-07-28 ENCOUNTER — Other Ambulatory Visit: Payer: BC Managed Care – PPO

## 2021-08-01 ENCOUNTER — Encounter: Payer: BC Managed Care – PPO | Admitting: Family Medicine

## 2021-08-02 ENCOUNTER — Other Ambulatory Visit: Payer: Self-pay | Admitting: Family Medicine

## 2021-08-02 DIAGNOSIS — J3089 Other allergic rhinitis: Secondary | ICD-10-CM

## 2021-08-02 DIAGNOSIS — E785 Hyperlipidemia, unspecified: Secondary | ICD-10-CM

## 2021-08-02 DIAGNOSIS — I10 Essential (primary) hypertension: Secondary | ICD-10-CM

## 2021-08-04 NOTE — Telephone Encounter (Signed)
Requested medication (s) are due for refill today: yes ? ?Requested medication (s) are on the active medication list: yes ? ?Last refill:  01/13/21 #90/1 ? ?Future visit scheduled: yes ? ?Notes to clinic:  pt is overdue for an appt and labs. Appt scheduled for 09/08/21 ? ? ?  ?Requested Prescriptions  ?Pending Prescriptions Disp Refills  ? atenolol (TENORMIN) 25 MG tablet [Pharmacy Med Name: ATENOLOL 25 MG TAB] 90 tablet 1  ?  Sig: TAKE 1 TABLET BY MOUTH ONCE DAILY  ?  ? Cardiovascular: Beta Blockers 2 Failed - 08/02/2021  4:42 PM  ?  ?  Failed - Cr in normal range and within 360 days  ?  Creat  ?Date Value Ref Range Status  ?07/22/2020 1.02 0.60 - 1.35 mg/dL Final  ?  ?  ?  ?  Failed - Valid encounter within last 6 months  ?  Recent Outpatient Visits   ? ?      ? 1 year ago Annual physical exam  ? [facility], Netta Neat, DO  ? 1 year ago Major depressive disorder, recurrent, mild (HCC)  ? [facility] Smitty Cords, DO  ? ?  ?  ?Future Appointments   ? ?        ? In 1 month Karamalegos, Netta Neat, DO [facility], [facility]  ? ?  ? ?  ?  ?  Passed - Last BP in normal range  ?  BP Readings from Last 1 Encounters:  ?07/26/20 126/78  ?  ?  ?  ?  Passed - Last Heart Rate in normal range  ?  Pulse Readings from Last 1 Encounters:  ?07/26/20 79  ?  ?  ?  ?  ? montelukast (SINGULAIR) 10 MG tablet [Pharmacy Med Name: MONTELUKAST SODIUM 10 MG TAB] 90 tablet 1  ?  Sig: TAKE 1 TABLET BY MOUTH AT BEDTIME  ?  ? Pulmonology:  Leukotriene Inhibitors Failed - 08/02/2021  4:42 PM  ?  ?  Failed - Valid encounter within last 12 months  ?  Recent Outpatient Visits   ? ?      ? 1 year ago Annual physical exam  ? [facility], Netta Neat, DO  ? 1 year ago Major depressive disorder, recurrent, mild (HCC)  ? [facility] Smitty Cords, DO  ? ?  ?  ?Future Appointments   ? ?        ? In 1 month Karamalegos, Netta Neat, DO  [facility], [facility]  ? ?  ? ?  ?  ?  ? atorvastatin (LIPITOR) 20 MG tablet [Pharmacy Med Name: ATORVASTATIN CALCIUM 20 MG TAB] 90 tablet 1  ?  Sig: TAKE 1 TABLET BY MOUTH ONCE DAILY  ?  ? Cardiovascular:  Antilipid - Statins Failed - 08/02/2021  4:42 PM  ?  ?  Failed - Valid encounter within last 12 months  ?  Recent Outpatient Visits   ? ?      ? 1 year ago Annual physical exam  ? [facility] [place], Netta Neat, DO  ? 1 year ago Major depressive disorder, recurrent, mild (HCC)  ? [facility] Smitty Cords, DO  ? ?  ?  ?Future Appointments   ? ?        ? In 1 month Karamalegos, Netta Neat, DO [facility], [facility]  ? ?  ? ?  ?  ?  Failed - Lipid Panel in normal range within the last 12 months  ?  Cholesterol  ?Date Value Ref Range Status  ?07/22/2020 150 <200 mg/dL Final  ? ?LDL Cholesterol (Calc)  ?Date Value Ref Range Status  ?07/22/2020 82 mg/dL (calc) Final  ?  Comment:  ?  Reference range: <100 ?[name] Kitchen ?Desirable range <100 mg/dL for primary prevention;   ?<70 mg/dL for patients with CHD or diabetic patients  ?with > or = 2 CHD risk factors. ?. ?LDL-C is now calculated using the Martin-Hopkins  ?calculation, which is a validated novel method providing  ?better accuracy than the Friedewald equation in the  ?estimation of LDL-C.  ?Horald Pollen et al. Lenox Ahr. 7262;035(59): 2061-2068  ?(http://education.[facility].com/faq/FAQ164) ?  ? ?HDL  ?Date Value Ref Range Status  ?07/22/2020 35 (L) > OR = 40 mg/dL Final  ? ?Triglycerides  ?Date Value Ref Range Status  ?07/22/2020 238 (H) <150 mg/dL Final  ?  Comment:  ?  . ?If a non-fasting specimen was collected, consider ?repeat triglyceride testing on a fasting specimen ?if clinically indicated.  ?Henrene Pastor al. J. of Clin. Lipidol. 2015;9:129-169. ?. ?  ? ?  ?  ?  Passed - Patient is not pregnant  ?  ?  ? lisinopril (ZESTRIL) 10 MG tablet [Pharmacy Med Name: LISINOPRIL 10 MG TAB] 90 tablet 1  ?  Sig:  TAKE 1 TABLET BY MOUTH ONCE DAILY  ?  ? Cardiovascular:  ACE Inhibitors Failed - 08/02/2021  4:42 PM  ?  ?  Failed - Cr in normal range and within 180 days  ?  Creat  ?Date Value Ref Range Status  ?07/22/2020 1.02 0.60 - 1.35 mg/dL Final  ?  ?  ?  ?  Failed - K in normal range and within 180 days  ?  Potassium  ?Date Value Ref Range Status  ?07/22/2020 4.3 3.5 - 5.3 mmol/L Final  ?  ?  ?  ?  Failed - Valid encounter within last 6 months  ?  Recent Outpatient Visits   ? ?      ? 1 year ago Annual physical exam  ? [facility] [place], Netta Neat, DO  ? 1 year ago Major depressive disorder, recurrent, mild (HCC)  ? [facility] Smitty Cords, DO  ? ?  ?  ?Future Appointments   ? ?        ? In 1 month Karamalegos, Netta Neat, DO [facility], [facility]  ? ?  ? ?  ?  ?  Passed - Patient is not pregnant  ?  ?  Passed - Last BP in normal range  ?  BP Readings from Last 1 Encounters:  ?07/26/20 126/78  ?  ?  ?  ?  ? ?

## 2021-08-06 ENCOUNTER — Encounter: Payer: Self-pay | Admitting: Family Medicine

## 2021-08-06 ENCOUNTER — Telehealth (INDEPENDENT_AMBULATORY_CARE_PROVIDER_SITE_OTHER): Payer: BC Managed Care – PPO | Admitting: Family Medicine

## 2021-08-06 VITALS — Ht 69.0 in | Wt 254.0 lb

## 2021-08-06 DIAGNOSIS — G43011 Migraine without aura, intractable, with status migrainosus: Secondary | ICD-10-CM

## 2021-08-06 DIAGNOSIS — G43901 Migraine, unspecified, not intractable, with status migrainosus: Secondary | ICD-10-CM | POA: Diagnosis not present

## 2021-08-06 DIAGNOSIS — R110 Nausea: Secondary | ICD-10-CM

## 2021-08-06 MED ORDER — ONDANSETRON 4 MG PO TBDP: 4.0000 mg | ORAL_TABLET | Freq: Three times a day (TID) | ORAL | 0 refills | Status: DC | PRN

## 2021-08-06 MED ORDER — RIZATRIPTAN BENZOATE 10 MG PO TBDP: 10.0000 mg | ORAL_TABLET | ORAL | 2 refills | Status: DC | PRN

## 2021-08-06 NOTE — Patient Instructions (Signed)
1. You most likely have Chronic Migraine Headaches ?- Migraine headaches present differently for many patients, pain is usually throbbing or aching, often on one side of head or behind the eye. They tend to last for up to hours or days. In treating migraines, our goal is to 1) stop the headache and 2) prevent recurrence of headaches ? ?Treatment to STOP the headache at this time: ?- Start with Rizatriptan 10mg  - take 1 immediately at onset of moderate to severe migraine headache, if unresolved or return within 2 hours then repeat dose 1 tablet, that is max dose for 24 hours. (Note - this medication can cause a brief episode of flushing and chest pressure or pain very soon after taking it. That is NORMAL, and it is the medicine taking effect and dilating some blood vessels. It should pass, and resolve within seconds to minutes after - it may not happen at all) ? ?- Try this for 1-2 weeks, if absolutely NOT helping then contact me to discuss changes, possibly can double sumatriptan dose or try nasal spray ?- You can still take over the counter meds with Ibuprofen up to 600-800mg  per dose 3 times a day with food for a few days and may try Excedrin Migraine as needed, ONLY use these after you have tried Sumatriptan up to 2 doses, and try to limit their use if they are not effective ? ?Treatment to PREVENT headaches: ?- Goal is to avoid triggers. We need to learn more details on what are your exact or possible headache triggers first. ?- Keep detailed headache diary (on printed handout) for possible triggers, bring this to your next visit to discuss further ?- Known possible triggers include caffeine, chocolate, alcohol, stress, weather changes, menstrual cycle, certain other foods ?- Also be aware that OTC pain meds/anti-inflammatories can cause rebound headache, they help resolve the headache but then after the effect wears off they can CAUSE a headache. Try to taper down and stop these medications and allow them to  get out of your system for 1-2 weeks  ? ?Look into some of these alternatives for Migraine Headache Prophylaxis or preventative treatment ? ?Antidepressant Type - Venlafaxine, Amitriptyline ?Blood pressure meds - Metoprolol, Propanolol, Verapamil ?Anti Headache/Seizure/Nerve medications - Topamax, Gabapentin ? ?Check into insurance for cost and coverage of the following migraine prevention  Medications ? ?Pill ?- Nurtec ODT 75mg  - can do abortive therapy (stop migraine) about 8 pills per month, use as needed, preferred max dose is one every 2 days. ?(This is the NEXT option I would recommend for you) ? ?- ALSO Nurtec ODT 75mg  same medicine can be approved for "Preventative" Prophylaxis use to prevent migraines, can be taken ONE pill every OTHER day, 16 pills per month. ? ?There is a special mail order pharmacy for this that we can use and help get this approved and quick access to medications. ? ?Injection ? ?Emgality, others include Aimovig and Ajovy ?- Typically one dose a month - auto injector - for prevention as well. ? ?We do have samples of this injection if you want to try the initial sample loading dose to get started for free before we order it. ? ?(these are once a month self injection medications that work directly to block migraine signals. They are relatively new, can be costly and have been proven to be very safe and effective. We can try to get them approved or for low cost if needed in future.) ? ? ?Please schedule a Follow-up Appointment to:  No follow-ups on file. ? ?If you have any other questions or concerns, please feel free to call the office or send a message through MyChart. You may also schedule an earlier appointment if necessary. ? ?Additionally, you may be receiving a survey about your experience at our office within a few days to 1 week by e-mail or mail. We value your feedback. ? ?Saralyn Pilar, DO ?[facility], [place] ?

## 2021-08-06 NOTE — Progress Notes (Signed)
? ?Subjective:  ? ? Patient ID: Willie Randolph, male    DOB: [birth year], 31 y.o.   MRN: [id number] ? ?Willie Randolph is a 31 y.o. male presenting on 08/06/2021 for Migraine ? ?Virtual / Telehealth Encounter - Video Visit via MyChart ?The purpose of this virtual visit is to provide medical care while limiting exposure to the novel coronavirus (COVID19) for both patient and office staff. ? ?Consent was obtained for remote visit:  Yes.   ?Answered questions that patient had about telehealth interaction:  Yes.   ?I discussed the limitations, risks, security and privacy concerns of performing an evaluation and management service by video/telephone. I also discussed with the patient that there may be a patient responsible charge related to this service. The patient expressed understanding and agreed to proceed. ? ?Patient Location: Work ?Provider Location: [facility] (Office) ? ?Participants in virtual visit: ?- Patient: Willie Randolph ?- CMA: Orinda Kenner, CMA ?- Provider: Dr Parks Ranger ? ?HPI ? ?Migraine Headaches, recurrent ?Nausea ?Prior chronic history, usually migraines only 1-2 times a year ?Lately worsening 3-4 weeks increasing frequency ?Reports acute onset since Sunday afternoon, has had persistent status migraine headache that is currently present still. Moderate to severe headache pain ?He has tried OTC medications, improving hydration. ?Associated with depressive thoughts with pain  ?Admits nausea vomiting, sensitivity to light and sound. ?No prior regular migraine management or medication before ? ?Depression screen [facility] 2/9 08/06/2021 07/26/2020 04/15/2020  ?Decreased Interest 1 1 1   ?Down, Depressed, Hopeless 1 1 1   ?PHQ - 2 Score 2 2 2   ?Altered sleeping 0 0 0  ?Tired, decreased energy 1 1 1   ?Change in appetite 1 1 1   ?Feeling bad or failure about yourself  1 1 1   ?Trouble concentrating 0 0 2  ?Moving slowly or fidgety/restless 0 0 0  ?Suicidal thoughts 0 - -  ?PHQ-9 Score 5 5 7   ?Difficult  doing work/chores Somewhat difficult Not difficult at all Somewhat difficult  ? ? ?Social History  ? ?Tobacco Use  ? Smoking status: Never  ? Smokeless tobacco: Never  ?Substance Use Topics  ? Alcohol use: Yes  ?  Comment: occ  ? Drug use: No  ? ? ?Review of Systems ?Per HPI unless specifically indicated above ? ?   ?Objective:  ?  ?Ht 5\' 9"  (1.753 m)   Wt 254 lb (115.2 kg)   BMI 37.51 kg/m?   ?Wt Readings from Last 3 Encounters:  ?08/06/21 254 lb (115.2 kg)  ?07/26/20 254 lb 6.4 oz (115.4 kg)  ?04/15/20 250 lb (113.4 kg)  ?  ?Physical Exam ? ?Note examination was completely remotely via video observation objective data only ? ?Gen - well-appearing, no acute distress or apparent pain, comfortable ?HEENT - eyes appear clear without discharge or redness ?Heart/Lungs - cannot examine virtually - observed no evidence of coughing or labored breathing. ?Abd - cannot examine virtually  ?Skin - face visible today- no rash ?Neuro - awake, alert, oriented ?Psych - not anxious appearing ? ?Results for orders placed or performed in visit on 07/22/20  ?Comprehensive metabolic panel  ?Result Value Ref Range  ? Glucose, Bld 91 65 - 99 mg/dL  ? BUN 14 7 - 25 mg/dL  ? Creat 1.02 0.60 - 1.35 mg/dL  ? BUN/Creatinine Ratio NOT APPLICABLE 6 - 22 (calc)  ? Sodium 139 135 - 146 mmol/L  ? Potassium 4.3 3.5 - 5.3 mmol/L  ? Chloride 104 98 - 110 mmol/L  ? CO2 25  20 - 32 mmol/L  ? Calcium 9.6 8.6 - 10.3 mg/dL  ? Total Protein 6.8 6.1 - 8.1 g/dL  ? Albumin 4.5 3.6 - 5.1 g/dL  ? Globulin 2.3 1.9 - 3.7 g/dL (calc)  ? AG Ratio 2.0 1.0 - 2.5 (calc)  ? Total Bilirubin 0.3 0.2 - 1.2 mg/dL  ? Alkaline phosphatase (APISO) 96 36 - 130 U/L  ? AST 12 10 - 40 U/L  ? ALT 18 9 - 46 U/L  ?Hemoglobin A1c  ?Result Value Ref Range  ? Hgb A1c MFr Bld 5.4 <5.7 % of total Hgb  ? Mean Plasma Glucose 108 mg/dL  ? eAG (mmol/L) 6.0 mmol/L  ?CBC with Differential/Platelet  ?Result Value Ref Range  ? WBC 5.7 3.8 - 10.8 Thousand/uL  ? RBC 4.69 4.20 - 5.80 Million/uL  ?  Hemoglobin 14.2 13.2 - 17.1 g/dL  ? HCT 41.1 38.5 - 50.0 %  ? MCV 87.6 80.0 - 100.0 fL  ? MCH 30.3 27.0 - 33.0 pg  ? MCHC 34.5 32.0 - 36.0 g/dL  ? RDW 12.2 11.0 - 15.0 %  ? Platelets 283 140 - 400 Thousand/uL  ? MPV 11.2 7.5 - 12.5 fL  ? Neutro Abs 3,118 1,500 - 7,800 cells/uL  ? Lymphs Abs 2,052 850 - 3,900 cells/uL  ? Absolute Monocytes 450 200 - 950 cells/uL  ? Eosinophils Absolute 29 15 - 500 cells/uL  ? Basophils Absolute 51 0 - 200 cells/uL  ? Neutrophils Relative % 54.7 %  ? Total Lymphocyte 36.0 %  ? Monocytes Relative 7.9 %  ? Eosinophils Relative 0.5 %  ? Basophils Relative 0.9 %  ?Lipid panel  ?Result Value Ref Range  ? Cholesterol 150 <200 mg/dL  ? HDL 35 (L) > OR = 40 mg/dL  ? Triglycerides 238 (H) <150 mg/dL  ? LDL Cholesterol (Calc) 82 mg/dL (calc)  ? Total CHOL/HDL Ratio 4.3 <5.0 (calc)  ? Non-HDL Cholesterol (Calc) 115 <130 mg/dL (calc)  ? ?   ?Assessment & Plan:  ? ?Problem List Items Addressed This Visit   ?None ?Visit Diagnoses   ? ? Intractable migraine without aura and with status migrainosus    -  Primary  ? Relevant Medications  ? rizatriptan (MAXALT-MLT) 10 MG disintegrating tablet  ? Status migrainosus      ? Relevant Medications  ? rizatriptan (MAXALT-MLT) 10 MG disintegrating tablet  ? Nausea      ? Relevant Medications  ? ondansetron (ZOFRAN-ODT) 4 MG disintegrating tablet  ? ?  ?  ?Consistent with persistent migraine HA x 4 days/weeks w/ intermittent worsening, uncertain trigger ?Chronic known Migraine HAs now more frequent ?Nausea w/o vomiting ?Inadequately treated for migraine HA at home ? ?No prior rx management before. ? ?Plan: ?1. Start abortive therapy with Rizatriptan 10mg  tabs - take 1 PRN (#10, 0 refill due to quantity limit), severe HA, may repeat dose within 2 hr if persistent, no more in 24 hours, in future can titrate dose to 50-100mg  if needed. Counseling on potential side effect / intolerance with chest discomfort acutely after taking sumatriptan ?2. Recommend taking  Ibuprofen 600-800mg  q 8 hr PRN, and can try Tylenol 1000mg  TID alternatively ?3. Avoid triggers including foods, caffeine. Important to rest. ?- Discussion on future migraine prophylaxis medications - handout given, review options at next visit, determine need if using triptan frequently ?4. Start headache diary, handout given, identify triggers for avoidance, bring to next visit ?5. Return criteria given for acute migraine, when to go to  office vs ED ? ?Order Zofran ODT for PRN use as well ? ?Future consider Nurtec ODT as next option ? ? ? ?Meds ordered this encounter  ?Medications  ? rizatriptan (MAXALT-MLT) 10 MG disintegrating tablet  ?  Sig: Take 1 tablet (10 mg total) by mouth as needed for migraine. May repeat in 2 hours if needed  ?  Dispense:  10 tablet  ?  Refill:  2  ? ondansetron (ZOFRAN-ODT) 4 MG disintegrating tablet  ?  Sig: Take 1 tablet (4 mg total) by mouth every 8 (eight) hours as needed for nausea or vomiting.  ?  Dispense:  30 tablet  ?  Refill:  0  ? ? ? ? ?Follow up plan: ?Return if symptoms worsen or fail to improve. ? ? ?Patient verbalizes understanding with the above medical recommendations including the limitation of remote medical advice. ? ?Specific follow-up and call-back criteria were given for patient to follow-up or seek medical care more urgently if needed. ? ?Total duration of direct patient care provided via video conference: 18 minutes ? ? ?Nobie Putnam, DO ?[facility] ?[facility] ?08/06/2021, 11:40 AM ?

## 2021-08-07 ENCOUNTER — Encounter: Payer: Self-pay | Admitting: Family Medicine

## 2021-08-18 ENCOUNTER — Encounter: Payer: Self-pay | Admitting: Family Medicine

## 2021-08-18 DIAGNOSIS — G43011 Migraine without aura, intractable, with status migrainosus: Secondary | ICD-10-CM

## 2021-08-19 MED ORDER — NURTEC 75 MG PO TBDP
75.0000 mg | ORAL_TABLET | ORAL | 2 refills | Status: DC
Start: 2021-08-19 — End: 2021-11-20

## 2021-08-19 NOTE — Addendum Note (Signed)
Addended by: Olin Hauser on: 08/19/2021 01:52 PM ? ? Modules accepted: Orders ? ?

## 2021-08-27 NOTE — Progress Notes (Signed)
? ?Referring:  ?Smitty CordsKaramalegos, Alexander J, DO ?[address] ?[place] [id number] ? ?PCP: ?Smitty CordsKaramalegos, Alexander J, DO ? ?Neurology was asked to evaluate Willie BurkeJacob Thelander, a 31 year old male for a chief complaint of headaches.  Our recommendations of care will be communicated by shared medical record.   ? ?CC:  headaches ? ?History provided from self, wife [place]Victoria ? ?HPI:  ?Medical co-morbidities: HTN, GAD, HLD, nephrolithiasis ? ?The patient presents for evaluation of worsening headaches since Christmas 2022. No clear triggers for onset. He has not been diagnosed with migraines previously, but states that before this he would have one severe headache per year.  Headaches progressively increased in frequency in December and became constant in the past month. They are described as a pulling, clawing sensation in his left vertex with associated nausea, vomiting, and lightheadedness. No photophobia or phonophobia. When is headache is at its worst he will see dark spots in both eyes which last for a few seconds at a time. Headaches are worse with coughing and sneezing. Takes Excedrin and Advil multiple times per day. ? ?He was recently started on Maxalt 10 mg PRN which was ineffective. OTCs have not made a difference. Was prescribed Nurtec but he never received it from the specialty pharmacy. ? ?Headache History: ?Onset: December 2022 ?Triggers: coughing, sneezing ?Aura: dark spots ?Location: left vertex ?Quality/Description: pulling, clawing ?Severity: 7-8/10  ?Associated Symptoms: ? Photophobia: no ? Phonophobia: no ? Nausea: yes ?Vomiting: yes ?Light headedness ?Worse with activity?: yes ?Duration of headaches: constant ? ?Headache days per month: 30 ?Headache free days per month: 0 ? ?Current Treatment: ?Abortive ?Excedrin ?Advil ? ?Preventative ?none ? ?Prior Therapies                                 ?Maxalt 10 mg PRN ?Nurtec 75 mg PRN ?Zofran 10 mg PRN ?Lisinopril 10 mg daily ?Atenolol 25 mg daily ?Escitalopram 20  mg daily ?Topamax contraindicate due to kidney stones ? ?LABS: ?CBC ?   ?Component Value Date/Time  ? WBC 5.7 07/22/2020 0803  ? RBC 4.69 07/22/2020 0803  ? HGB 14.2 07/22/2020 0803  ? HCT 41.1 07/22/2020 0803  ? PLT 283 07/22/2020 0803  ? MCV 87.6 07/22/2020 0803  ? MCH 30.3 07/22/2020 0803  ? MCHC 34.5 07/22/2020 0803  ? RDW 12.2 07/22/2020 0803  ? LYMPHSABS 2,052 07/22/2020 0803  ? MONOABS 0.5 01/09/2015 1429  ? EOSABS 29 07/22/2020 0803  ? BASOSABS 51 07/22/2020 0803  ? ? ?  Latest Ref Rng & Units 07/22/2020  ?  8:03 AM 09/05/2019  ? 12:00 AM 01/15/2015  ?  2:29 AM  ?CMP  ?Glucose 65 - 99 mg/dL 91    604120    ?BUN 7 - 25 mg/dL 14   14      20     ?Creatinine 0.60 - 1.35 mg/dL 5.401.02   1.1      9.811.61    ?Sodium 135 - 146 mmol/L 139   138      139    ?Potassium 3.5 - 5.3 mmol/L 4.3   4.6      4.2    ?Chloride 98 - 110 mmol/L 104    102    ?CO2 20 - 32 mmol/L 25    27    ?Calcium 8.6 - 10.3 mg/dL 9.6    9.5    ?Total Protein 6.1 - 8.1 g/dL 6.8  7.3    ?Total Bilirubin 0.2 - 1.2 mg/dL 0.3    0.7    ?Alkaline Phos 38 - 126 U/L   74    ?AST 10 - 40 U/L 12    22    ?ALT 9 - 46 U/L 18   28      23     ?  ? This result is from an external source.  ? ? ? ?IMAGING:  ?none ? ? ?Current Outpatient Medications on File Prior to Visit  ?Medication Sig Dispense Refill  ? aspirin EC 81 MG tablet Take 1 tablet (81 mg total) by mouth daily.    ? atenolol (TENORMIN) 25 MG tablet TAKE 1 TABLET BY MOUTH ONCE DAILY 90 tablet 1  ? atorvastatin (LIPITOR) 20 MG tablet TAKE 1 TABLET BY MOUTH ONCE DAILY 90 tablet 1  ? escitalopram (LEXAPRO) 20 MG tablet TAKE 1 TABLET BY MOUTH ONCE DAILY 90 tablet 0  ? lisinopril (ZESTRIL) 10 MG tablet TAKE 1 TABLET BY MOUTH ONCE DAILY 90 tablet 1  ? montelukast (SINGULAIR) 10 MG tablet TAKE 1 TABLET BY MOUTH AT BEDTIME 90 tablet 1  ? ondansetron (ZOFRAN-ODT) 4 MG disintegrating tablet Take 1 tablet (4 mg total) by mouth every 8 (eight) hours as needed for nausea or vomiting. 30 tablet 0  ? rizatriptan (MAXALT-MLT)  10 MG disintegrating tablet Take 1 tablet (10 mg total) by mouth as needed for migraine. May repeat in 2 hours if needed 10 tablet 2  ? NURTEC 75 MG TBDP Take 75 mg by mouth every other day. For headache prevention. If develop headache on day between dose may take on that day and skip next. Max 1 tablet in 24 hours. 16 tablet 2  ? triamterene-hydrochlorothiazide (DYAZIDE) 37.5-25 MG capsule Take 1 each (1 capsule total) by mouth daily. (Patient not taking: Reported on 08/28/2021) 90 capsule 1  ? ?No current facility-administered medications on file prior to visit.  ? ? ? ?Allergies: ?No Known Allergies ? ?Family History: ?Migraine or other headaches in the family:  no ?Aneurysms in a first degree relative:  no ?Brain tumors in the family:  no ?Other neurological illness in the family:   no ? ?Past Medical History: ?Past Medical History:  ?Diagnosis Date  ? Allergy   ? Depression   ? GERD (gastroesophageal reflux disease)   ? Hypertension   ? Kidney stones   ? Migraine   ? ? ?Past Surgical History ?Past Surgical History:  ?Procedure Laterality Date  ? KIDNEY STONE SURGERY    ? LITHOTRIPSY    ? ? ?Social History: ?Social History  ? ?Tobacco Use  ? Smoking status: Never  ? Smokeless tobacco: Never  ?Substance Use Topics  ? Alcohol use: Yes  ?  Comment: occ  ? Drug use: No  ? ? ?ROS: ?Negative for fevers, chills. Positive for headaches. All other systems reviewed and negative unless stated otherwise in HPI. ? ? ?Physical Exam:  ? ?Vital Signs: ?BP 127/81   Pulse 72   Ht 5\' 9"  (1.753 m)   Wt 242 lb 9.6 oz (110 kg)   BMI 35.83 kg/m?  ?GENERAL: well appearing,in no acute distress,alert ?SKIN:  Color, texture, turgor normal. No rashes or lesions ?HEAD:  Normocephalic/atraumatic. ?CV:  RRR ?RESP: Normal respiratory effort ?MSK: +tenderness to palpation over right occiput and neck ? ?NEUROLOGICAL: ?Mental Status: Alert, oriented to person, place and time,Follows commands ?Cranial Nerves: OD 4 mm, OS 5 mm, no papilledema  visualized, equally reactive to  light, visual fields intact to confrontation, extraocular movements intact, facial sensation intact, no facial droop or ptosis, hearing grossly intact, no dysarthria ?Motor: muscle strength 5/5 both upper and lower extremities ?Reflexes: 2+ throughout ?Sensation: intact to light touch all 4 extremities ?Coordination: Finger-to- nose-finger intact bilaterally ?Gait: normal-based ? ? ?IMPRESSION: ?31 year old male with a history of HTN, GAD, HLD, nephrolithiasis who presents for evaluation of worsening headaches since December 2022. His exam is significant for anisocoria (OS 47mm, OD 78mm). He has not noticed a difference in his pupil sizes previously and is not sure if this is new. Will order MRI brain as well as MRA head to assess for structural causes of worsening headache including aneurysm. Counseled patient to present to ED if he develops sudden severe headache or new neurologic symptoms. No papilledema visualized on direct fundoscopy today. Will have him set up an appointment with his eye doctor for a formal eye exam. ? ?PLAN: ?-Medrol dosepak ?-MRI/MRA brain ?-Patient will set up appointment with his eye doctor for formal eye exam ? ? ?I spent a total of 35 minutes chart reviewing and counseling the patient. Headache education was done. Discussed medication side effects, adverse reactions and drug interactions. Written educational materials and patient instructions outlining all of the above were given. ? ?Follow-up: 4 months or sooner if needed ? ? ?Ocie Doyne, MD ?08/28/2021   ?1:32 PM ? ? ?

## 2021-08-28 ENCOUNTER — Ambulatory Visit: Payer: BC Managed Care – PPO | Admitting: Psychiatry

## 2021-08-28 ENCOUNTER — Encounter: Payer: Self-pay | Admitting: Psychiatry

## 2021-08-28 ENCOUNTER — Telehealth: Payer: Self-pay | Admitting: Psychiatry

## 2021-08-28 VITALS — BP 127/81 | HR 72 | Ht 69.0 in | Wt 242.6 lb

## 2021-08-28 DIAGNOSIS — H5702 Anisocoria: Secondary | ICD-10-CM | POA: Diagnosis not present

## 2021-08-28 DIAGNOSIS — R519 Headache, unspecified: Secondary | ICD-10-CM | POA: Diagnosis not present

## 2021-08-28 MED ORDER — METHYLPREDNISOLONE 4 MG PO TBPK: ORAL_TABLET | ORAL | 0 refills | Status: DC

## 2021-08-28 NOTE — Telephone Encounter (Signed)
Angie has  recently found out that with [employer] they do not pay for both MRI Brain and MRA Head that are done together and this patient has [employer].... which exam do you prefer him to have first??  ?

## 2021-08-28 NOTE — Patient Instructions (Signed)
MRI and MRA brain ?Steroid pack to help break current headache ?Set up an appointment with your eye doctor for an eye exam ?

## 2021-08-28 NOTE — Telephone Encounter (Signed)
We can do MRI first, thanks

## 2021-09-01 NOTE — Telephone Encounter (Signed)
MRI Brain auth: [id number] (exp. 08/29/21 to 09/27/21) ? ?MRA Head auth: [id number] (exp. 08/29/21 to 09/27/21) ? ?Left a voicemail for the patient to call back to schedule.  ?

## 2021-09-01 NOTE — Telephone Encounter (Signed)
Patient returned my call he is scheduled at [facility] for 09/02/21 for the MRI Brain & 09/03/21 for the MRA Head at [facility].  ?

## 2021-09-02 ENCOUNTER — Ambulatory Visit (INDEPENDENT_AMBULATORY_CARE_PROVIDER_SITE_OTHER): Payer: BC Managed Care – PPO

## 2021-09-02 DIAGNOSIS — R519 Headache, unspecified: Secondary | ICD-10-CM | POA: Diagnosis not present

## 2021-09-02 MED ORDER — GADOBENATE DIMEGLUMINE 529 MG/ML IV SOLN
20.0000 mL | Freq: Once | INTRAVENOUS | Status: AC | PRN
  Administered 2021-09-02: 20 mL via INTRAVENOUS

## 2021-09-03 ENCOUNTER — Other Ambulatory Visit: Payer: BC Managed Care – PPO

## 2021-09-03 ENCOUNTER — Ambulatory Visit: Payer: Self-pay | Admitting: Psychiatry

## 2021-09-03 ENCOUNTER — Ambulatory Visit (INDEPENDENT_AMBULATORY_CARE_PROVIDER_SITE_OTHER): Payer: BC Managed Care – PPO

## 2021-09-03 DIAGNOSIS — R519 Headache, unspecified: Secondary | ICD-10-CM | POA: Diagnosis not present

## 2021-09-05 ENCOUNTER — Other Ambulatory Visit: Payer: Self-pay

## 2021-09-05 DIAGNOSIS — Z0000 Encounter for general adult medical examination without abnormal findings: Secondary | ICD-10-CM

## 2021-09-08 ENCOUNTER — Encounter: Payer: BC Managed Care – PPO | Admitting: Family Medicine

## 2021-09-08 ENCOUNTER — Other Ambulatory Visit: Payer: BC Managed Care – PPO

## 2021-09-09 ENCOUNTER — Other Ambulatory Visit: Payer: BC Managed Care – PPO

## 2021-09-09 ENCOUNTER — Ambulatory Visit: Payer: Self-pay | Admitting: Psychiatry

## 2021-09-10 LAB — CBC WITH DIFFERENTIAL/PLATELET
Absolute Monocytes: 319 cells/uL (ref 200–950)
Basophils Absolute: 28 cells/uL (ref 0–200)
Basophils Relative: 0.5 %
Eosinophils Absolute: 39 cells/uL (ref 15–500)
Eosinophils Relative: 0.7 %
HCT: 43.8 % (ref 38.5–50.0)
Hemoglobin: 14.8 g/dL (ref 13.2–17.1)
Lymphs Abs: 1842 cells/uL (ref 850–3900)
MCH: 30 pg (ref 27.0–33.0)
MCHC: 33.8 g/dL (ref 32.0–36.0)
MCV: 88.8 fL (ref 80.0–100.0)
MPV: 10.9 fL (ref 7.5–12.5)
Monocytes Relative: 5.7 %
Neutro Abs: 3371 cells/uL (ref 1500–7800)
Neutrophils Relative %: 60.2 %
Platelets: 262 10*3/uL (ref 140–400)
RBC: 4.93 10*6/uL (ref 4.20–5.80)
RDW: 12.2 % (ref 11.0–15.0)
Total Lymphocyte: 32.9 %
WBC: 5.6 10*3/uL (ref 3.8–10.8)

## 2021-09-10 LAB — COMPREHENSIVE METABOLIC PANEL
AG Ratio: 1.9 (calc) (ref 1.0–2.5)
ALT: 20 U/L (ref 9–46)
AST: 13 U/L (ref 10–40)
Albumin: 4.2 g/dL (ref 3.6–5.1)
Alkaline phosphatase (APISO): 91 U/L (ref 36–130)
BUN: 15 mg/dL (ref 7–25)
CO2: 21 mmol/L (ref 20–32)
Calcium: 9.5 mg/dL (ref 8.6–10.3)
Chloride: 110 mmol/L (ref 98–110)
Creat: 1 mg/dL (ref 0.60–1.26)
Globulin: 2.2 g/dL (calc) (ref 1.9–3.7)
Glucose, Bld: 94 mg/dL (ref 65–99)
Potassium: 4.3 mmol/L (ref 3.5–5.3)
Sodium: 142 mmol/L (ref 135–146)
Total Bilirubin: 0.3 mg/dL (ref 0.2–1.2)
Total Protein: 6.4 g/dL (ref 6.1–8.1)

## 2021-09-10 LAB — LIPID PANEL
Cholesterol: 153 mg/dL (ref <200)
HDL: 44 mg/dL (ref >=40)
LDL Cholesterol (Calc): 75 mg/dL (calc)
Non-HDL Cholesterol (Calc): 109 mg/dL (calc) (ref <130)
Total CHOL/HDL Ratio: 3.5 (calc) (ref <5.0)
Triglycerides: 258 mg/dL — ABNORMAL HIGH (ref <150)

## 2021-09-10 LAB — TSH: TSH: 2.16 mIU/L (ref 0.40–4.50)

## 2021-09-12 ENCOUNTER — Ambulatory Visit (INDEPENDENT_AMBULATORY_CARE_PROVIDER_SITE_OTHER): Payer: BC Managed Care – PPO | Admitting: Family Medicine

## 2021-09-12 ENCOUNTER — Encounter: Payer: Self-pay | Admitting: Family Medicine

## 2021-09-12 VITALS — BP 120/76 | HR 76 | Ht 66.0 in | Wt 242.6 lb

## 2021-09-12 DIAGNOSIS — F330 Major depressive disorder, recurrent, mild: Secondary | ICD-10-CM | POA: Diagnosis not present

## 2021-09-12 DIAGNOSIS — F411 Generalized anxiety disorder: Secondary | ICD-10-CM

## 2021-09-12 DIAGNOSIS — Z0000 Encounter for general adult medical examination without abnormal findings: Secondary | ICD-10-CM

## 2021-09-12 DIAGNOSIS — E782 Mixed hyperlipidemia: Secondary | ICD-10-CM | POA: Diagnosis not present

## 2021-09-12 DIAGNOSIS — G43009 Migraine without aura, not intractable, without status migrainosus: Secondary | ICD-10-CM

## 2021-09-12 DIAGNOSIS — I10 Essential (primary) hypertension: Secondary | ICD-10-CM

## 2021-09-12 DIAGNOSIS — G43909 Migraine, unspecified, not intractable, without status migrainosus: Secondary | ICD-10-CM | POA: Insufficient documentation

## 2021-09-12 MED ORDER — ESCITALOPRAM OXALATE 20 MG PO TABS: 20.0000 mg | ORAL_TABLET | Freq: Every day | ORAL | 3 refills | Status: DC

## 2021-09-12 NOTE — Progress Notes (Signed)
? ?Subjective:  ? ? Patient ID: Willie Randolph, male    DOB: 30-Sep-1990, 31 y.o.   MRN: [id number] ? ?Willie Randolph is a 31 y.o. male presenting on 09/12/2021 for Annual Exam ? ? ?HPI ? ?Here for Annual Physical and Lab Review. ?  ?Major Depression, recurrent chronic ?Generalized Anxiety GAD ?History of long term issue with mood depression episodic flares. He was told in past may have some sort of anxious or irritable ADHD-like symptoms, questioning if side effect of medication. ?Strong fam history of depression and mood symptoms, siblings, father, mother. ?- Has not seen Psychiatry or therapist in past. Only PCP ?- He is currently on Lexapro 20mg  daily he is doing well. See scores below. ?- Previously on Celexa ?  ?He has goal to be more present in future to focus more on home life and family, after work and not bring work home with him. ?  ?CHRONIC HTN: ?Morbid Obesity BMI >37 ?Reports chronic history of HTN, has been controlled on HTN. ?Current Meds: ?-  Atenolol 25mg  daily, Lisinopril 10mg  daily ?- OFF Dyazide (triamterene-HCTZ) 37.5-25mg  daily   ?Reports good compliance, took meds today. Tolerating well, w/o complaints. ?Denies CP, dyspnea, HA, edema, dizziness / lightheadedness ?  ?HYPERLIPIDEMIA: ?Hypertriglyceridemia ?Obesity BMI >39 ?- Reports no concerns. Last lipid panel 08/2021 LDL down to 75, TG >250 similar to prior. ?- Currently taking Atorvastatin 20mg  daily, tolerating well without side effects or myalgias ?- Not taking Omega 3 fish oil but may try. ?  ?GERD ?Taking Famotidine 20mg  BID ?  ?History of Kidney Stones ?History of x 3 stones in past. He had a 41mm stone in 2011 had a surgical removal of kidney stone at that time by Urology near [facility]. ? ?Migraine Headaches, recurrent ?Nausea ?Prior chronic history, usually migraines only 1-2 times a year ? ?Recent updates, for Migraines, maxalt was ineffective. Trial on Nurtec ODT prevention dose every other day with good success. Significant improved on  nurtec QOD dosing, with only 2-4 breakthrough migraine headaches. He can still take Maxalt PRN if has headache, reduces symptoms and severity of pain from HA 7 out of 10 down to 3 out of 10. He has also established with [facility] on 08/28/21, and they have ordered MR and MRA ? ? ? ? ?  09/12/2021  ? 10:37 AM 08/06/2021  ? 11:23 AM 07/26/2020  ?  2:50 PM  ?Depression screen PHQ 2/9  ?Decreased Interest 1 1 1   ?Down, Depressed, Hopeless 1 1 1   ?PHQ - 2 Score 2 2 2   ?Altered sleeping 0 0 0  ?Tired, decreased energy 2 1 1   ?Change in appetite 1 1 1   ?Feeling bad or failure about yourself  1 1 1   ?Trouble concentrating 1 0 0  ?Moving slowly or fidgety/restless 1 0 0  ?Suicidal thoughts 0 0   ?PHQ-9 Score 8 5 5   ?Difficult doing work/chores Somewhat difficult Somewhat difficult Not difficult at all  ? ? ?  09/12/2021  ? 10:37 AM 08/06/2021  ? 11:24 AM 07/26/2020  ?  2:50 PM 04/15/2020  ?  3:52 PM  ?GAD 7 : Generalized Anxiety Score  ?Nervous, Anxious, on Edge 1 3 3 1   ?Control/stop worrying 2 0 0 1  ?Worry too much - different things 2 1 1 2   ?Trouble relaxing 1 2 2 1   ?Restless 1 0 0 1  ?Easily annoyed or irritable 2 0 0 2  ?Afraid - awful might happen 1 0 0 0  ?  Total GAD 7 Score 10 6 6 8   ?Anxiety Difficulty Somewhat difficult Very difficult Very difficult Somewhat difficult  ? ? ? ? ?Past Medical History:  ?Diagnosis Date  ? Allergy   ? Depression   ? GERD (gastroesophageal reflux disease)   ? Hypertension   ? Kidney stones   ? Migraine   ? ?Past Surgical History:  ?Procedure Laterality Date  ? KIDNEY STONE SURGERY    ? LITHOTRIPSY    ? ?Social History  ? ?Socioeconomic History  ? Marital status: Married  ?  Spouse name: [place]  ? Number of children: 1  ? Years of education: Not on file  ? Highest education level: Master's degree (e.g., MA, MS, MEng, MEd, MSW, MBA)  ?Occupational History  ? Not on file  ?Tobacco Use  ? Smoking status: Never  ? Smokeless tobacco: Never  ?Substance and Sexual Activity  ? Alcohol use:  Yes  ?  Comment: occ  ? Drug use: No  ? Sexual activity: Not on file  ?Other Topics Concern  ? Not on file  ?Social History Narrative  ? Lives with wife  ? Caffeine- sodas 2-3 a day  ? ?Social Determinants of Health  ? ?Financial Resource Strain: Not on file  ?Food Insecurity: Not on file  ?Transportation Needs: Not on file  ?Physical Activity: Not on file  ?Stress: Not on file  ?Social Connections: Not on file  ?Intimate Partner Violence: Not on file  ? ?Family History  ?Problem Relation Age of Onset  ? Depression Mother   ? Heart disease Father   ? Diabetes Father   ? Depression Father   ? Depression Brother   ? Heart attack Maternal Grandfather 71  ? COPD Paternal Grandmother 73  ? Heart attack Paternal Grandfather 10  ? ?Current Outpatient Medications on File Prior to Visit  ?Medication Sig  ? aspirin EC 81 MG tablet Take 1 tablet (81 mg total) by mouth daily.  ? atenolol (TENORMIN) 25 MG tablet TAKE 1 TABLET BY MOUTH ONCE DAILY  ? atorvastatin (LIPITOR) 20 MG tablet TAKE 1 TABLET BY MOUTH ONCE DAILY  ? lisinopril (ZESTRIL) 10 MG tablet TAKE 1 TABLET BY MOUTH ONCE DAILY  ? methylPREDNISolone (MEDROL DOSEPAK) 4 MG TBPK tablet Take as directed by packaging  ? montelukast (SINGULAIR) 10 MG tablet TAKE 1 TABLET BY MOUTH AT BEDTIME  ? NURTEC 75 MG TBDP Take 75 mg by mouth every other day. For headache prevention. If develop headache on day between dose may take on that day and skip next. Max 1 tablet in 24 hours.  ? ondansetron (ZOFRAN-ODT) 4 MG disintegrating tablet Take 1 tablet (4 mg total) by mouth every 8 (eight) hours as needed for nausea or vomiting.  ? rizatriptan (MAXALT-MLT) 10 MG disintegrating tablet Take 1 tablet (10 mg total) by mouth as needed for migraine. May repeat in 2 hours if needed  ? ?No current facility-administered medications on file prior to visit.  ? ? ?Review of Systems  ?Constitutional:  Negative for activity change, appetite change, chills, diaphoresis, fatigue and fever.  ?HENT:   Negative for congestion and hearing loss.   ?Eyes:  Negative for visual disturbance.  ?Respiratory:  Negative for cough, chest tightness, shortness of breath and wheezing.   ?Cardiovascular:  Negative for chest pain, palpitations and leg swelling.  ?Gastrointestinal:  Negative for abdominal pain, constipation, diarrhea, nausea and vomiting.  ?Genitourinary:  Negative for dysuria, frequency and hematuria.  ?Musculoskeletal:  Negative for arthralgias and neck pain.  ?  Skin:  Negative for rash.  ?Neurological:  Negative for dizziness, weakness, light-headedness, numbness and headaches.  ?Hematological:  Negative for adenopathy.  ?Psychiatric/Behavioral:  Negative for behavioral problems, dysphoric mood and sleep disturbance.   ?Per HPI unless specifically indicated above ? ? ?   ?Objective:  ?  ?BP 120/76   Pulse 76   Ht 5\' 6"  (1.676 m)   Wt 242 lb 9.6 oz (110 kg)   SpO2 100%   BMI 39.16 kg/m?   ?Wt Readings from Last 3 Encounters:  ?09/12/21 242 lb 9.6 oz (110 kg)  ?08/28/21 242 lb 9.6 oz (110 kg)  ?08/06/21 254 lb (115.2 kg)  ?  ?Physical Exam ?Vitals and nursing note reviewed.  ?Constitutional:   ?   General: He is not in acute distress. ?   Appearance: He is well-developed. He is not diaphoretic.  ?   Comments: Well-appearing, comfortable, cooperative  ?HENT:  ?   Head: Normocephalic and atraumatic.  ?Eyes:  ?   General:     ?   Right eye: No discharge.     ?   Left eye: No discharge.  ?   Conjunctiva/sclera: Conjunctivae normal.  ?   Pupils: Pupils are equal, round, and reactive to light.  ?Neck:  ?   Thyroid: No thyromegaly.  ?Cardiovascular:  ?   Rate and Rhythm: Normal rate and regular rhythm.  ?   Pulses: Normal pulses.  ?   Heart sounds: Normal heart sounds. No murmur heard. ?Pulmonary:  ?   Effort: Pulmonary effort is normal. No respiratory distress.  ?   Breath sounds: Normal breath sounds. No wheezing or rales.  ?Abdominal:  ?   General: Bowel sounds are normal. There is no distension.  ?   Palpations:  Abdomen is soft. There is no mass.  ?   Tenderness: There is no abdominal tenderness.  ?Musculoskeletal:     ?   General: No tenderness. Normal range of motion.  ?   Cervical back: Normal range of motion and nec

## 2021-09-12 NOTE — Assessment & Plan Note (Signed)
See A&P 

## 2021-09-12 NOTE — Assessment & Plan Note (Signed)
Well-controlled HTN ?No known complications  ?- Off Triamterene-HCTZ ?  ?Plan:  ?1. Continue current BP regimen -  Atenolol 25mg  daily, Lisinopril 10mg  daily ?2. Encourage improved lifestyle - low sodium diet, regular exercise ?3. Continue monitor BP outside office, bring readings to next visit, if persistently >140/90 or new symptoms notify office sooner ?

## 2021-09-12 NOTE — Assessment & Plan Note (Signed)
Controlled cholesterol on statin and lifestyle ?Elevated TG however still ? ?Plan: ?1. Continue current meds - Atorvastatin 20mg  daily ?Add Fish Oil omega 3, 1,000 to 2,000 daily for TG ?2. Encourage improved lifestyle - low carb/cholesterol, reduce portion size, continue improving regular exercise ?

## 2021-09-12 NOTE — Assessment & Plan Note (Signed)
BMI >39 with comorbid factors major depression, HTN, GERD Encourage lifestyle diet exercise. 

## 2021-09-12 NOTE — Assessment & Plan Note (Signed)
Significantly improved migraine/HA control ?Still has 3-4 breakthrough headaches in past 2 weeks. ?Limited success on Maxalt - helpful for reducing severity ? ?Neuro - MRI MRA negative recently done 08/2021 ? ?Improved now on Nurtec ODT QOD dosing for prophylaxis, can take on the skip day if new headache, then skip next dose. ?Will add refills when ready through [facility] ?Continue with Neurology [facility] - following with them, has had imaging negative. ?

## 2021-09-12 NOTE — Patient Instructions (Addendum)
Thank you for coming to the office today. ? ?Elevated Triglycerides, but only mild to moderate range. ?No rx medication needed. Rest of cholesterol looks good. ? ?Rest of labs are good, kidney liver, thyroid, blood counts. ? ?Try the Omega 3 Fish Oil (or Plant based) Omega 3 capsules 1,000 mg per dose, 1-2 per day with a meal. ? ?Try to limit carbs / starches / and keep working on weight loss. ? ?Keep on Nurtec as planned every other day may have extra refills, otherwise when running low we can add extras. Same for Maxalt. ? ?Please schedule a Follow-up Appointment to: Return in about 6 months (around 03/14/2022) for 6 month follow-up Migraines, Mood/Anxiety, HTN. ? ?If you have any other questions or concerns, please feel free to call the office or send a message through MyChart. You may also schedule an earlier appointment if necessary. ? ?Additionally, you may be receiving a survey about your experience at our office within a few days to 1 week by e-mail or mail. We value your feedback. ? ?Saralyn Pilar, DO ?[facility], [place] ?

## 2021-09-12 NOTE — Assessment & Plan Note (Signed)
Major depression, recurrent mild / GAD Anxiety Not followed by Psychiatry or therapy Continue Escitalopram (Lexapro) 20mg daily, improved results Future consider other med option Future consider counseling/therapy 

## 2021-11-05 ENCOUNTER — Encounter (INDEPENDENT_AMBULATORY_CARE_PROVIDER_SITE_OTHER): Payer: BC Managed Care – PPO | Admitting: Psychiatry

## 2021-11-05 DIAGNOSIS — H538 Other visual disturbances: Secondary | ICD-10-CM | POA: Diagnosis not present

## 2021-11-06 NOTE — Telephone Encounter (Signed)

## 2021-11-20 ENCOUNTER — Other Ambulatory Visit: Payer: Self-pay | Admitting: Family Medicine

## 2021-11-20 DIAGNOSIS — G43011 Migraine without aura, intractable, with status migrainosus: Secondary | ICD-10-CM

## 2021-11-20 MED ORDER — NURTEC 75 MG PO TBDP: 75.0000 mg | ORAL_TABLET | ORAL | 5 refills | Status: DC

## 2021-11-20 NOTE — Telephone Encounter (Signed)
Copied from CRM [id number]. Topic: General - Other >> Nov 20, 2021 12:25 PM Everette C wrote: Reason for CRM: Medication Refill - Medication: NURTEC 75 MG TBDP [[id number]]   Has the patient contacted their pharmacy? Yes.  The patient's pharmacy has made contact on their behalf  (Agent: If no, request that the patient contact the pharmacy for the refill. If patient does not wish to contact the pharmacy document the reason why and proceed with request.) (Agent: If yes, when and what did the pharmacy advise?)  Preferred Pharmacy (with phone number or street name): [facility] [place] (New Address) - [place], [place] - [address] AT Previously: [place] [address], [place] [address] [place] [address] Phone: [phone] Fax: [phone] Hours: Not open 24 hours  Has the patient been seen for an appointment in the last year OR does the patient have an upcoming appointment? Yes.    Agent: Please be advised that RX refills may take up to 3 business days. We ask that you follow-up with your pharmacy.

## 2021-11-20 NOTE — Telephone Encounter (Signed)
Requested medication (s) are due for refill today - yes  Requested medication (s) are on the active medication list -yes  Future visit scheduled -no  Last refill: 07/31/21 #16 2RF  Notes to clinic: medication not assigned protocol- provider review   Requested Prescriptions  Pending Prescriptions Disp Refills   NURTEC 75 MG TBDP 16 tablet 2    Sig: Take 75 mg by mouth every other day. For headache prevention. If develop headache on day between dose may take on that day and skip next. Max 1 tablet in 24 hours.     Off-Protocol Failed - 11/20/2021 12:43 PM      Failed - Medication not assigned to a protocol, review manually.      Passed - Valid encounter within last 12 months    Recent Outpatient Visits           2 months ago Annual physical exam   [facility] [place], Netta Neat, DO   3 months ago Intractable migraine without aura and with status migrainosus   [facility] [place], Netta Neat, DO   1 year ago Annual physical exam   [facility] Smitty Cords, DO   1 year ago Major depressive disorder, recurrent, mild [facility])   [place] Smitty Cords, DO                 Requested Prescriptions  Pending Prescriptions Disp Refills   NURTEC 75 MG TBDP 16 tablet 2    Sig: Take 75 mg by mouth every other day. For headache prevention. If develop headache on day between dose may take on that day and skip next. Max 1 tablet in 24 hours.     Off-Protocol Failed - 11/20/2021 12:43 PM      Failed - Medication not assigned to a protocol, review manually.      Passed - Valid encounter within last 12 months    Recent Outpatient Visits           2 months ago Annual physical exam   [facility] [place], Netta Neat, DO   3 months ago Intractable migraine without aura and with status migrainosus   [facility] [place], Netta Neat, DO   1 year ago  Annual physical exam   [facility] Smitty Cords, DO   1 year ago Major depressive disorder, recurrent, mild [facility])   [facility] [place], Netta Neat, DO

## 2022-01-17 ENCOUNTER — Other Ambulatory Visit: Payer: Self-pay | Admitting: Family Medicine

## 2022-01-17 DIAGNOSIS — I10 Essential (primary) hypertension: Secondary | ICD-10-CM

## 2022-01-17 DIAGNOSIS — J3089 Other allergic rhinitis: Secondary | ICD-10-CM

## 2022-01-17 DIAGNOSIS — E785 Hyperlipidemia, unspecified: Secondary | ICD-10-CM

## 2022-01-19 NOTE — Telephone Encounter (Signed)
Requested Prescriptions  Pending Prescriptions Disp Refills  . lisinopril (ZESTRIL) 10 MG tablet [Pharmacy Med Name: LISINOPRIL 10 MG TAB] 90 tablet 0    Sig: TAKE 1 TABLET BY MOUTH ONCE DAILY     Cardiovascular:  ACE Inhibitors Passed - 01/17/2022  2:51 PM      Passed - Cr in normal range and within 180 days    Creat  Date Value Ref Range Status  09/09/2021 1.00 0.60 - 1.26 mg/dL Final         Passed - K in normal range and within 180 days    Potassium  Date Value Ref Range Status  09/09/2021 4.3 3.5 - 5.3 mmol/L Final         Passed - Patient is not pregnant      Passed - Last BP in normal range    BP Readings from Last 1 Encounters:  09/12/21 120/76         Passed - Valid encounter within last 6 months    Recent Outpatient Visits          4 months ago Annual physical exam   [facility] Smitty Cords, DO   5 months ago Intractable migraine without aura and with status migrainosus   [facility] [place], Netta Neat, DO   1 year ago Annual physical exam   [facility] Smitty Cords, DO   1 year ago Major depressive disorder, recurrent, mild (HCC)   [facility], Alexander J, DO             . atorvastatin (LIPITOR) 20 MG tablet [Pharmacy Med Name: ATORVASTATIN CALCIUM 20 MG TAB] 90 tablet 1    Sig: TAKE 1 TABLET BY MOUTH ONCE DAILY     Cardiovascular:  Antilipid - Statins Failed - 01/17/2022  2:51 PM      Failed - Lipid Panel in normal range within the last 12 months    Cholesterol  Date Value Ref Range Status  09/09/2021 153 <200 mg/dL Final   LDL Cholesterol (Calc)  Date Value Ref Range Status  09/09/2021 75 mg/dL (calc) Final    Comment:    Reference range: <100 . Desirable range <100 mg/dL for primary prevention;   <70 mg/dL for patients with CHD or diabetic patients  with > or = 2 CHD risk factors. [name] Kitchen LDL-C is now calculated using the Martin-Hopkins   calculation, which is a validated novel method providing  better accuracy than the Friedewald equation in the  estimation of LDL-C.  Horald Pollen et al. Lenox Ahr. 4765;465(03): 2061-2068  (http://education.[employer].com/faq/FAQ164)    HDL  Date Value Ref Range Status  09/09/2021 44 > OR = 40 mg/dL Final   Triglycerides  Date Value Ref Range Status  09/09/2021 258 (H) <150 mg/dL Final    Comment:    . If a non-fasting specimen was collected, consider repeat triglyceride testing on a fasting specimen if clinically indicated.  Perry Mount et al. J. of Clin. Lipidol. 2015;9:129-169. [name] Kitchen          Passed - Patient is not pregnant      Passed - Valid encounter within last 12 months    Recent Outpatient Visits          4 months ago Annual physical exam   [facility] Smitty Cords, DO   5 months ago Intractable migraine without aura and with status migrainosus   [facility] [place], Netta Neat,  DO   1 year ago Annual physical exam   [facility] [place], Netta Neat, DO   1 year ago Major depressive disorder, recurrent, mild [facility])   [facility], Netta Neat, DO             . montelukast (SINGULAIR) 10 MG tablet [Pharmacy Med Name: MONTELUKAST SODIUM 10 MG TAB] 90 tablet 1    Sig: TAKE 1 TABLET BY MOUTH AT BEDTIME     Pulmonology:  Leukotriene Inhibitors Passed - 01/17/2022  2:51 PM      Passed - Valid encounter within last 12 months    Recent Outpatient Visits          4 months ago Annual physical exam   [facility] Smitty Cords, DO   5 months ago Intractable migraine without aura and with status migrainosus   [facility] [place], Netta Neat, DO   1 year ago Annual physical exam   [facility] Smitty Cords, DO   1 year ago Major depressive disorder, recurrent, mild [facility])   [facility]  [place], Netta Neat, DO

## 2022-01-29 ENCOUNTER — Other Ambulatory Visit: Payer: Self-pay | Admitting: Family Medicine

## 2022-01-29 DIAGNOSIS — G43901 Migraine, unspecified, not intractable, with status migrainosus: Secondary | ICD-10-CM

## 2022-01-30 NOTE — Telephone Encounter (Signed)
Requested Prescriptions  Pending Prescriptions Disp Refills  . rizatriptan (MAXALT-MLT) 10 MG disintegrating tablet [Pharmacy Med Name: RIZATRIPTAN BENZOATE 10 MG ODT] 10 tablet 2    Sig: DISSOLVE 1 TABLET ON THE TONGUE AS NEEDED FOR MIGRAINE. MAY REPEAT IN 2 HOURS IF NEEDED     Neurology:  Migraine Therapy - Triptan Passed - 01/29/2022  8:44 AM      Passed - Last BP in normal range    BP Readings from Last 1 Encounters:  09/12/21 120/76         Passed - Valid encounter within last 12 months    Recent Outpatient Visits          4 months ago Annual physical exam   [facility] [place], Netta Neat, DO   5 months ago Intractable migraine without aura and with status migrainosus   [facility] [place], Netta Neat, DO   1 year ago Annual physical exam   [facility] Smitty Cords, DO   1 year ago Major depressive disorder, recurrent, mild [facility])   [facility] [place], Netta Neat, DO

## 2022-04-17 ENCOUNTER — Other Ambulatory Visit: Payer: Self-pay | Admitting: Family Medicine

## 2022-04-17 DIAGNOSIS — I10 Essential (primary) hypertension: Secondary | ICD-10-CM

## 2022-04-20 ENCOUNTER — Other Ambulatory Visit: Payer: Self-pay | Admitting: Family Medicine

## 2022-04-20 DIAGNOSIS — G43011 Migraine without aura, intractable, with status migrainosus: Secondary | ICD-10-CM

## 2022-04-20 NOTE — Telephone Encounter (Signed)
Requested medication (s) are due for refill today: yes  Requested medication (s) are on the active medication list: yes  Last refill:  01/19/22  Future visit scheduled: yes  Notes to clinic:  Unable to refill per protocol, courtesy refill already given, routing for provider approval.      Requested Prescriptions  Pending Prescriptions Disp Refills   lisinopril (ZESTRIL) 10 MG tablet [Pharmacy Med Name: LISINOPRIL 10 MG TAB] 90 tablet 0    Sig: TAKE 1 TABLET BY MOUTH ONCE DAILY     Cardiovascular:  ACE Inhibitors Failed - 04/17/2022  6:47 PM      Failed - Cr in normal range and within 180 days    Creat  Date Value Ref Range Status  09/09/2021 1.00 0.60 - 1.26 mg/dL Final         Failed - K in normal range and within 180 days    Potassium  Date Value Ref Range Status  09/09/2021 4.3 3.5 - 5.3 mmol/L Final         Failed - Valid encounter within last 6 months    Recent Outpatient Visits           7 months ago Annual physical exam   [facility] Smitty Cords, DO   8 months ago Intractable migraine without aura and with status migrainosus   [facility] [place], Netta Neat, DO   1 year ago Annual physical exam   [facility] Smitty Cords, DO   2 years ago Major depressive disorder, recurrent, mild [facility])   [facility], Netta Neat, DO              Passed - Patient is not pregnant      Passed - Last BP in normal range    BP Readings from Last 1 Encounters:  09/12/21 120/76

## 2022-04-21 NOTE — Telephone Encounter (Signed)
Requested medication (s) are due for refill today: yes  Requested medication (s) are on the active medication list: yes  Last refill:  11/20/21 #16 5 RF  Future visit scheduled: no  Notes to clinic:  med not assigned to a protocol   Requested Prescriptions  Pending Prescriptions Disp Refills   NURTEC 75 MG TBDP [Pharmacy Med Name: NURTEC 75MG  TBDP] 16 tablet 5    Sig: TAKE 1 TABLET ON OR UNDER THE TONGUE EVERY OTHER DAY. FOR HEADACHE PREVENTION. IF DEVELOP HEADACHE ON DAY BETWEEN DOSE MAY TAKE ON THAT DAY AND SKIP NEXT. MAX 1 TABLET IN 24 HOURS.     Off-Protocol Failed - 04/20/2022  2:53 PM      Failed - Medication not assigned to a protocol, review manually.      Passed - Valid encounter within last 12 months    Recent Outpatient Visits           7 months ago Annual physical exam   [facility], [place], DO   8 months ago Intractable migraine without aura and with status migrainosus   [facility] [place], [place], DO   1 year ago Annual physical exam   [facility], DO   2 years ago Major depressive disorder, recurrent, mild [facility])   [facility] [place], [place], DO

## 2022-05-04 ENCOUNTER — Encounter: Payer: Self-pay | Admitting: Family Medicine

## 2022-05-22 ENCOUNTER — Encounter: Payer: Self-pay | Admitting: Family Medicine

## 2022-05-22 ENCOUNTER — Ambulatory Visit: Payer: BC Managed Care – PPO | Admitting: Family Medicine

## 2022-05-22 VITALS — BP 136/86 | HR 82 | Ht 66.0 in | Wt 250.0 lb

## 2022-05-22 DIAGNOSIS — F902 Attention-deficit hyperactivity disorder, combined type: Secondary | ICD-10-CM | POA: Diagnosis not present

## 2022-05-22 DIAGNOSIS — F331 Major depressive disorder, recurrent, moderate: Secondary | ICD-10-CM

## 2022-05-22 DIAGNOSIS — F909 Attention-deficit hyperactivity disorder, unspecified type: Secondary | ICD-10-CM | POA: Insufficient documentation

## 2022-05-22 MED ORDER — BUPROPION HCL ER (XL) 150 MG PO TB24
150.0000 mg | ORAL_TABLET | Freq: Every day | ORAL | 2 refills | Status: DC
Start: 2022-05-22 — End: 2022-05-22

## 2022-05-22 MED ORDER — BUPROPION HCL ER (XL) 150 MG PO TB24: 150.0000 mg | ORAL_TABLET | Freq: Every day | ORAL | 1 refills | Status: DC

## 2022-05-22 NOTE — Progress Notes (Signed)
Subjective:    Patient ID: Willie Randolph, male    DOB: [birth year], 31 y.o.   MRN: [id number]  Willie Randolph is a 31 y.o. male presenting on 05/22/2022 for Anxiety and Depression   HPI  Major Depression, recurrent chronic Generalized Anxiety GAD History of long term issue with mood depression episodic flares. He was told in past may have some sort of anxious or irritable ADHD-like symptoms, questioning if side effect of medication. Strong fam history of depression and mood symptoms, siblings, father, mother. - Has not seen Psychiatry or therapist in past. Only PCP  - He is currently on Lexapro 20mg  daily he is doing well but still feels needs more improvement with some worsening stressors and depression. See scores below.  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      05/22/2022   11:36 AM 09/12/2021   10:37 AM 08/06/2021   11:23 AM  Depression screen PHQ 2/9  Decreased Interest 2 1 1   Down, Depressed, Hopeless 3 1 1   PHQ - 2 Score 5 2 2   Altered sleeping 1 0 0  Tired, decreased energy 2 2 1   Change in appetite 2 1 1   Feeling bad or failure about yourself  3 1 1   Trouble concentrating 3 1 0  Moving slowly or fidgety/restless 1 1 0  Suicidal thoughts 2 0 0  PHQ-9 Score 19 8 5   Difficult doing work/chores Very difficult Somewhat difficult Somewhat difficult   [place]-Suicide Severity Rating Scale 1) Have you wished you were dead or wished you could go to sleep and not wake up? - Yes  2) Have you had any actual thoughts of killing yourself? - No  Skip questions 3,4, 5  6) Have you ever done anything, started to do anything, or prepared to do anything to end  your life? - No       05/22/2022   11:37 AM 09/12/2021   10:37 AM 08/06/2021   11:24 AM 07/26/2020    2:50 PM  GAD 7 : Generalized Anxiety Score  Nervous, Anxious, on Edge 2 1 3 3   Control/stop worrying 2 2 0 0  Worry too much - different things 3 2 1 1   Trouble relaxing 2 1 2 2   Restless 3 1 0 0  Easily annoyed or irritable 3 2 0 0  Afraid - awful might happen 1 1 0 0  Total GAD 7 Score 16 10 6 6   Anxiety Difficulty Somewhat difficult Somewhat difficult Very difficult Very difficult      Social History   Tobacco Use   Smoking status: Never   Smokeless tobacco: Never  Substance Use Topics   Alcohol use: Yes    Comment: occ   Drug use: No    Review of Systems Per HPI unless specifically indicated above     Objective:    BP 136/86   Pulse 82   Ht 5\' 6"  (1.676 m)   Wt 250 lb (113.4 kg)   SpO2 99%   BMI 40.35 kg/m   Wt Readings from Last 3 Encounters:  05/22/22 250 lb (113.4 kg)  09/12/21 242 lb 9.6 oz (110 kg)  08/28/21 242 lb 9.6 oz (110 kg)  Physical Exam Vitals and nursing note reviewed.  Constitutional:      General: He is not in acute distress.    Appearance: Normal appearance. He is well-developed. He is not diaphoretic.     Comments: Well-appearing, comfortable, cooperative  HENT:     Head: Normocephalic and atraumatic.  Eyes:     General:        Right eye: No discharge.        Left eye: No discharge.     Conjunctiva/sclera: Conjunctivae normal.  Cardiovascular:     Rate and Rhythm: Normal rate.  Pulmonary:     Effort: Pulmonary effort is normal.  Skin:    General: Skin is warm and dry.     Findings: No erythema or rash.  Neurological:     Mental Status: He is alert and oriented to person, place, and time.  Psychiatric:        Mood and Affect: Mood normal.        Behavior: Behavior normal.        Thought Content: Thought content normal.     Comments: Well groomed, good eye contact, normal speech and thoughts       Results for  orders placed or performed in visit on 09/05/21  CBC with Differential/Platelet  Result Value Ref Range   WBC 5.6 3.8 - 10.8 Thousand/uL   RBC 4.93 4.20 - 5.80 Million/uL   Hemoglobin 14.8 13.2 - 17.1 g/dL   HCT 20.0 37.9 - 44.4 %   MCV 88.8 80.0 - 100.0 fL   MCH 30.0 27.0 - 33.0 pg   MCHC 33.8 32.0 - 36.0 g/dL   RDW 61.9 01.2 - 22.4 %   Platelets 262 140 - 400 Thousand/uL   MPV 10.9 7.5 - 12.5 fL   Neutro Abs 3,371 1,500 - 7,800 cells/uL   Lymphs Abs 1,842 850 - 3,900 cells/uL   Absolute Monocytes 319 200 - 950 cells/uL   Eosinophils Absolute 39 15 - 500 cells/uL   Basophils Absolute 28 0 - 200 cells/uL   Neutrophils Relative % 60.2 %   Total Lymphocyte 32.9 %   Monocytes Relative 5.7 %   Eosinophils Relative 0.7 %   Basophils Relative 0.5 %  Comprehensive metabolic panel  Result Value Ref Range   Glucose, Bld 94 65 - 99 mg/dL   BUN 15 7 - 25 mg/dL   Creat 1.14 6.43 - 1.42 mg/dL   BUN/Creatinine Ratio NOT APPLICABLE 6 - 22 (calc)   Sodium 142 135 - 146 mmol/L   Potassium 4.3 3.5 - 5.3 mmol/L   Chloride 110 98 - 110 mmol/L   CO2 21 20 - 32 mmol/L   Calcium 9.5 8.6 - 10.3 mg/dL   Total Protein 6.4 6.1 - 8.1 g/dL   Albumin 4.2 3.6 - 5.1 g/dL   Globulin 2.2 1.9 - 3.7 g/dL (calc)   AG Ratio 1.9 1.0 - 2.5 (calc)   Total Bilirubin 0.3 0.2 - 1.2 mg/dL   Alkaline phosphatase (APISO) 91 36 - 130 U/L   AST 13 10 - 40 U/L   ALT 20 9 - 46 U/L  Lipid panel  Result Value Ref Range   Cholesterol 153 <200 mg/dL   HDL 44 > OR = 40 mg/dL   Triglycerides 767 (H) <150 mg/dL   LDL Cholesterol (Calc) 75 mg/dL (calc)   Total CHOL/HDL Ratio 3.5 <5.0 (calc)   Non-HDL Cholesterol (Calc) 109 <130 mg/dL (calc)  TSH  Result Value Ref Range   TSH 2.16  0.40 - 4.50 mIU/L      Assessment & Plan:   Problem List Items Addressed This Visit     ADHD   Relevant Medications   buPROPion (WELLBUTRIN XL) 150 MG 24 hr tablet   Major depressive disorder, recurrent, moderate (HCC) - Primary     Major depression, recurrent mild / GAD Anxiety Not followed by Psychiatry or therapy Failed Celexa  Worsening depression. Less benefit now on current SSRI Lexapro 20mg   Add new med Wellbutrin XL (Bupropion) add on for depression. It can help ADHD focus activity symptoms as well.  Continue Escitalopram Lexapro 20mg  for now  For now once adjusted to Wellbutrin if doing well we can keep this course, or if not quite strong enough but helpful, we can adjust that one to 300mg   If the Lexapro seems less effective still, we can taper it down and add on the Sertraline/Zoloft instead in the future.  Emphasis on future consult with Psych or Therapy esp if ADHD component to improve treatment goals, due to comorbid conditions      Relevant Medications   buPROPion (WELLBUTRIN XL) 150 MG 24 hr tablet      Meds ordered this encounter  Medications   DISCONTD: buPROPion (WELLBUTRIN XL) 150 MG 24 hr tablet    Sig: Take 1 tablet (150 mg total) by mouth daily.    Dispense:  30 tablet    Refill:  2   buPROPion (WELLBUTRIN XL) 150 MG 24 hr tablet    Sig: Take 1 tablet (150 mg total) by mouth daily.    Dispense:  90 tablet    Refill:  1    Updated rx to 3 month supply      Follow up plan: Return in about 6 weeks (around 07/03/2022) for 6 week follow up mood can do in person or virtual.   , DO [facility] 05/22/2022, 11:24 AM

## 2022-05-22 NOTE — Patient Instructions (Addendum)
Thank you for coming to the office today.  Continue Escitalopram Lexapro 20mg  in evening for now.  Add new med Wellbutrin XL (Bupropion) add on for depression. It can help ADHD focus activity symptoms as well.  For now once adjusted to Wellbutrin if doing well we can keep this course, or if not quite strong enough but helpful, we can adjust that one to 300mg   If the Lexapro seems less effective still, we can taper it down and add on the Sertraline/Zoloft instead in the future.  ----------------------------  Check into this list and if you can find a location that you prefer and is accepted by your insurance network, then let me know I can refer.  These offices have both PSYCHIATRY doctors and THERAPISTS  [facility] (Virtual Available) [place] [address] [place], [address] [place] Phone: [phone]  [facility] Address: [address] [place], [address] [place] [email or web address] Phone: [phone]  [facility] Address: [address], [place], [address] [place] Hours: 8:30AM-5PM Phone: [phone]  [facility] (Adult, Peds, Geriatric, Counseling) [address] [place], [address] [place] Phone: [phone] Fax: [phone]  [facility] [address] [place], [address] [place] Phone: [phone]  [facility] (All ages) [address], [facility] [place] [place], [place] Phone: [phone] (Option 1) [email or web address]  ----------------------------------------------------------------- THERAPIST ONLY  (No Psychiatry)  [facility] [address] [place], [address] [place] P: [phone]  [facility]) [facility], [facility] [address] [place], [address] [place] [phone]  [facility]   Address: [address] [employer], [place], [place] Hours: Open today  9AM-7PM Phone: [phone]  [facility] [address] [facility]  - [facility] Address: [address] [facility] [place], [place] Phone: [phone]   Please schedule a Follow-up Appointment to: Return in about 6 weeks (around 07/03/2022) for 6 week follow up mood can do in person or virtual.  If you have any other questions or concerns, please feel free to call the office or send a message through MyChart. You may also schedule an earlier appointment if necessary.  Additionally, you may be receiving a survey about your experience at our office within a few days to 1 week by e-mail or mail. We value your feedback.  [phone], DO [facility], Saralyn Pilar

## 2022-05-22 NOTE — Assessment & Plan Note (Addendum)
Major depression, recurrent mild / GAD Anxiety Not followed by Psychiatry or therapy Failed Celexa  Worsening depression. Less benefit now on current SSRI Lexapro 20mg   Add new med Wellbutrin XL (Bupropion) add on for depression. It can help ADHD focus activity symptoms as well.  Continue Escitalopram Lexapro 20mg  for now  For now once adjusted to Wellbutrin if doing well we can keep this course, or if not quite strong enough but helpful, we can adjust that one to 300mg   If the Lexapro seems less effective still, we can taper it down and add on the Sertraline/Zoloft instead in the future.  Emphasis on future consult with Psych or Therapy esp if ADHD component to improve treatment goals, due to comorbid conditions

## 2022-05-26 ENCOUNTER — Encounter: Payer: Self-pay | Admitting: Family Medicine

## 2022-05-26 DIAGNOSIS — F331 Major depressive disorder, recurrent, moderate: Secondary | ICD-10-CM

## 2022-05-26 DIAGNOSIS — F411 Generalized anxiety disorder: Secondary | ICD-10-CM

## 2022-05-26 DIAGNOSIS — F902 Attention-deficit hyperactivity disorder, combined type: Secondary | ICD-10-CM

## 2022-06-23 ENCOUNTER — Encounter: Payer: Self-pay | Admitting: Family Medicine

## 2022-09-22 ENCOUNTER — Ambulatory Visit: Payer: BC Managed Care – PPO | Admitting: Internal Medicine

## 2022-09-22 VITALS — BP 122/74 | HR 79 | Temp 97.1°F | Resp 18 | Wt 242.0 lb

## 2022-09-22 DIAGNOSIS — R519 Headache, unspecified: Secondary | ICD-10-CM

## 2022-09-22 DIAGNOSIS — H9203 Otalgia, bilateral: Secondary | ICD-10-CM

## 2022-09-22 DIAGNOSIS — R051 Acute cough: Secondary | ICD-10-CM | POA: Diagnosis not present

## 2022-09-22 DIAGNOSIS — J029 Acute pharyngitis, unspecified: Secondary | ICD-10-CM

## 2022-09-22 DIAGNOSIS — Z20818 Contact with and (suspected) exposure to other bacterial communicable diseases: Secondary | ICD-10-CM

## 2022-09-22 LAB — POC COVID19 BINAXNOW: SARS Coronavirus 2 Ag: NEGATIVE

## 2022-09-22 LAB — POCT INFLUENZA A/B
Influenza A, POC: NEGATIVE
Influenza B, POC: NEGATIVE

## 2022-09-22 LAB — POCT RAPID STREP A (OFFICE): Rapid Strep A Screen: NEGATIVE

## 2022-09-22 MED ORDER — METHYLPREDNISOLONE ACETATE 80 MG/ML IJ SUSP
80.0000 mg | Freq: Once | INTRAMUSCULAR | Status: AC
  Administered 2022-09-22: 80 mg via INTRAMUSCULAR

## 2022-09-22 NOTE — Patient Instructions (Signed)
Cough, Adult Coughing is a reflex that clears your throat and airways (respiratory system). It helps heal and protect your lungs. It is normal to cough from time to time. A cough that happens with other symptoms or that lasts a long time may be a sign of a condition that needs treatment. A short-term (acute) cough may only last 2-3 weeks. A long-term (chronic) cough may last 8 or more weeks. Coughing is often caused by: Diseases, such as: An infection of the respiratory system. Asthma or other heart or lung diseases. Gastroesophageal reflux. This is when acid comes back up from the stomach. Breathing in things that irritate your lungs. Allergies. Postnasal drip. This is when mucus runs down the back of your throat. Smoking. Some medicines. Follow these instructions at home: Medicines Take over-the-counter and prescription medicines only as told by your health care provider. Talk with your provider before you take cough medicine (cough suppressants). Eating and drinking Do not drink alcohol. Avoid caffeine. Drink enough fluid to keep your pee (urine) pale yellow. Lifestyle Avoid cigarette smoke. Do not use any products that contain nicotine or tobacco. These products include cigarettes, chewing tobacco, and vaping devices, such as e-cigarettes. If you need help quitting, ask your provider. Avoid things that make you cough. These may include perfumes, candles, cleaning products, or campfire smoke. General instructions  Watch for any changes to your cough. Tell your provider about them. Always cover your mouth when you cough. If the air is dry in your bedroom or home, use a cool mist vaporizer or humidifier. If your cough is worse at night, try to sleep in a semi-upright position. Rest as needed. Contact a health care provider if: You have new symptoms, or your symptoms get worse. You cough up pus. You have a fever that does not go away or a cough that does not get better after 2-3  weeks. You cannot control your cough with medicine, and you are losing sleep. You have pain that gets worse or is not helped with medicine. You lose weight for no clear reason. You have night sweats. Get help right away if: You cough up blood. You have trouble breathing. Your heart is beating very fast. These symptoms may be an emergency. Get help right away. Call 911. Do not wait to see if the symptoms will go away. Do not drive yourself to the hospital. This information is not intended to replace advice given to you by your health care provider. Make sure you discuss any questions you have with your health care provider. Document Revised: 01/16/2022 Document Reviewed: 01/16/2022 Elsevier Patient Education  2023 Elsevier Inc.  

## 2022-09-22 NOTE — Progress Notes (Signed)
Subjective:    Patient ID: Willie Randolph, male    DOB: 1990/06/11, 32 y.o.   MRN: [id number]  HPI  Patient presents to clinic today with complaint of headache, ear pain, sore throat, cough and chest congestion. This started 1 week ago but worsened this morning. The headache is located in the back of his head. He denies drainage from the ears or loss of hearing. He denies difficulty swallowing. The cough is productive of times of green mucous. He denies runny nose, nasal congestion, shortness of breath, vomiting or diarrhea. He denies fever, chills or body aches. He has tried Tylenol with minimal relief of symptoms. He has had sick contacts diagnosed with strep.  Review of Systems     Past Medical History:  Diagnosis Date   Allergy    Depression    GERD (gastroesophageal reflux disease)    Hypertension    Kidney stones    Migraine     Current Outpatient Medications  Medication Sig Dispense Refill   aspirin EC 81 MG tablet Take 1 tablet (81 mg total) by mouth daily.     atenolol (TENORMIN) 25 MG tablet TAKE 1 TABLET BY MOUTH ONCE DAILY 90 tablet 1   atorvastatin (LIPITOR) 20 MG tablet TAKE 1 TABLET BY MOUTH ONCE DAILY 90 tablet 1   Cariprazine HCl (VRAYLAR PO) Take by mouth.     escitalopram (LEXAPRO) 20 MG tablet Take 1 tablet (20 mg total) by mouth daily. 90 tablet 3   lisinopril (ZESTRIL) 10 MG tablet TAKE 1 TABLET BY MOUTH ONCE DAILY 90 tablet 0   methylPREDNISolone (MEDROL DOSEPAK) 4 MG TBPK tablet Take as directed by packaging 1 each 0   montelukast (SINGULAIR) 10 MG tablet TAKE 1 TABLET BY MOUTH AT BEDTIME 90 tablet 1   NURTEC 75 MG TBDP TAKE 1 TABLET ON OR UNDER THE TONGUE EVERY OTHER DAY. FOR HEADACHE PREVENTION. IF DEVELOP HEADACHE ON DAY BETWEEN DOSE MAY TAKE ON THAT DAY AND SKIP NEXT. MAX 1 TABLET IN 24 HOURS. 16 tablet 5   ondansetron (ZOFRAN-ODT) 4 MG disintegrating tablet Take 1 tablet (4 mg total) by mouth every 8 (eight) hours as needed for nausea or vomiting. 30  tablet 0   rizatriptan (MAXALT-MLT) 10 MG disintegrating tablet DISSOLVE 1 TABLET ON THE TONGUE AS NEEDED FOR MIGRAINE. MAY REPEAT IN 2 HOURS IF NEEDED 10 tablet 2   No current facility-administered medications for this visit.    No Known Allergies  Family History  Problem Relation Age of Onset   Depression Mother    Heart disease Father    Diabetes Father    Depression Father    Depression Brother    Heart attack Maternal Grandfather 43   COPD Paternal Grandmother 60   Heart attack Paternal Grandfather 33    Social History   Socioeconomic History   Marital status: Married    Spouse name: [place]   Number of children: 1   Years of education: Not on file   Highest education level: Master's degree (e.g., MA, MS, MEng, MEd, MSW, MBA)  Occupational History   Not on file  Tobacco Use   Smoking status: Never   Smokeless tobacco: Never  Substance and Sexual Activity   Alcohol use: Yes    Comment: occ   Drug use: No   Sexual activity: Not on file  Other Topics Concern   Not on file  Social History Narrative   Lives with wife   Caffeine- sodas 2-3 a day  Social Determinants of Health   Financial Resource Strain: Not on file  Food Insecurity: Not on file  Transportation Needs: Not on file  Physical Activity: Not on file  Stress: Not on file  Social Connections: Not on file  Intimate Partner Violence: Not on file     Constitutional: Patient reports fatigue, headache.  Denies fever, malaise, or abrupt weight changes.  HEENT: Patient reports ear pain and sore throat.  Denies eye pain, eye redness,ringing in the ears, wax buildup, runny nose, nasal congestion, bloody nose. Respiratory: Patient reports cough.  Denies difficulty breathing, shortness of breath.   Cardiovascular: Denies chest pain, chest tightness, palpitations or swelling in the hands or feet.  Gastrointestinal: Denies abdominal pain, bloating, constipation, diarrhea or blood in the stool.  Psych: Denies  anxiety, depression, SI/HI.  No other specific complaints in a complete review of systems (except as listed in HPI above).  Objective:   Physical Exam  BP 122/74   Pulse 79   Temp (!) 97.1 F (36.2 C)   Resp 18   Wt 225 lb (102.1 kg)   SpO2 99%   BMI 36.32 kg/m   Wt Readings from Last 3 Encounters:  05/22/22 250 lb (113.4 kg)  09/12/21 242 lb 9.6 oz (110 kg)  08/28/21 242 lb 9.6 oz (110 kg)    General: Appears his stated age, obese, in NAD. Skin: Warm, dry and intact.  HEENT: Head: normal shape and size, maxillary and frontal sinus tenderness noted; Eyes: sclera white, no icterus, conjunctiva pink, PERRLA and EOMs intact; Ears: Bilateral cerumen impaction; Nose: mucosa pink and moist, septum midline; Throat/Mouth: Teeth present, mucosa pink and moist, + PND, no exudate, lesions or ulcerations noted.  Neck: No adenopathy noted. Cardiovascular: Normal rate and rhythm. S1,S2 noted.  No murmur, rubs or gallops noted.  Pulmonary/Chest: Normal effort and positive vesicular breath sounds. No respiratory distress. No wheezes, rales or ronchi noted.  Neurological: Alert and oriented.    BMET    Component Value Date/Time   NA 142 09/09/2021 0837   NA 138 09/05/2019 0000   K 4.3 09/09/2021 0837   CL 110 09/09/2021 0837   CO2 21 09/09/2021 0837   GLUCOSE 94 09/09/2021 0837   BUN 15 09/09/2021 0837   BUN 14 09/05/2019 0000   CREATININE 1.00 09/09/2021 0837   CALCIUM 9.5 09/09/2021 0837   GFRNONAA 92 09/05/2019 0000   GFRAA >60 01/15/2015 0229    Lipid Panel     Component Value Date/Time   CHOL 153 09/09/2021 0837   TRIG 258 (H) 09/09/2021 0837   HDL 44 09/09/2021 0837   CHOLHDL 3.5 09/09/2021 0837   LDLCALC 75 09/09/2021 0837    CBC    Component Value Date/Time   WBC 5.6 09/09/2021 0837   RBC 4.93 09/09/2021 0837   HGB 14.8 09/09/2021 0837   HCT 43.8 09/09/2021 0837   PLT 262 09/09/2021 0837   MCV 88.8 09/09/2021 0837   MCH 30.0 09/09/2021 0837   MCHC 33.8  09/09/2021 0837   RDW 12.2 09/09/2021 0837   LYMPHSABS 1,842 09/09/2021 0837   MONOABS 0.5 01/09/2015 1429   EOSABS 39 09/09/2021 0837   BASOSABS 28 09/09/2021 0837    Hgb A1C Lab Results  Component Value Date   HGBA1C 5.4 07/22/2020           Assessment & Plan:   Acute Headache, Sinus Pressure, Ear Pain, Sore Throat, Cough:  Rapid flu/COVID negative Rapid strep negative Encouraged rest and fluids 80  mg Depo Medrol IM for symptom management Recommend Zyrtec and Flonase OTC for symptom management Okay to take Ibuprofen or Chloraseptic spray as needed for sore throat   Follow-up with your PCP as previously scheduled Nicki Reaper, NP

## 2022-09-22 NOTE — Addendum Note (Signed)
Addended by: Judd Gaudier on: 09/22/2022 03:19 PM   Modules accepted: Orders

## 2022-09-25 ENCOUNTER — Ambulatory Visit: Payer: BC Managed Care – PPO | Admitting: Internal Medicine

## 2022-09-25 ENCOUNTER — Telehealth: Payer: Self-pay

## 2022-09-25 ENCOUNTER — Ambulatory Visit: Payer: Self-pay

## 2022-09-25 ENCOUNTER — Encounter: Payer: Self-pay | Admitting: Internal Medicine

## 2022-09-25 VITALS — BP 136/82 | HR 96 | Temp 96.6°F | Wt 241.0 lb

## 2022-09-25 DIAGNOSIS — R0981 Nasal congestion: Secondary | ICD-10-CM | POA: Diagnosis not present

## 2022-09-25 DIAGNOSIS — R519 Headache, unspecified: Secondary | ICD-10-CM | POA: Diagnosis not present

## 2022-09-25 DIAGNOSIS — J029 Acute pharyngitis, unspecified: Secondary | ICD-10-CM

## 2022-09-25 DIAGNOSIS — Z20818 Contact with and (suspected) exposure to other bacterial communicable diseases: Secondary | ICD-10-CM

## 2022-09-25 MED ORDER — AMOXICILLIN-POT CLAVULANATE 875-125 MG PO TABS: 1.0000 | ORAL_TABLET | Freq: Two times a day (BID) | ORAL | 0 refills | Status: DC

## 2022-09-25 MED ORDER — PREDNISONE 10 MG PO TABS: 10.0000 mg | ORAL_TABLET | Freq: Every day | ORAL | 0 refills | Status: DC

## 2022-09-25 NOTE — Telephone Encounter (Signed)
  Chief Complaint: Sore throat - strep exposure Symptoms: Sore throat, intermittent fever, nausea, sweating Frequency:  Pertinent Negatives: Patient denies  Disposition: [] ED /[] Urgent Care (no appt availability in office) / [x] Appointment(In office/virtual)/ []  [facility]/ [] Home Care/ [] Refused Recommended Disposition /[] [facility]/ []  Follow-up with PCP Additional Notes: PT was seen in office for same s/s. Pt is feeling worse. Pt states that this is the worst sore throat he has ever had.  Summary: Evaluated 09/22/22 pt feeling worse Advice   Pt is calling to report that he was evaluated on 09/22/22 reporting feeling worse today with sx that include: nausea, sweating, and sore throat. Please advise     Reason for Disposition  SEVERE (e.g., excruciating) throat pain  Answer Assessment - Initial Assessment Questions 1. STREP EXPOSURE: "Was the exposure to someone who lives within your home?" If not, ask: "How much contact did you have with the sick person?"      Daughter 2. ONSET: "How many days ago did the contact occur?"      ongoing 3. PROVEN STREP: "Are you sure the person with strep had a positive throat culture or rapid strep test?"      unsure 4. STREP SYMPTOMS: "Do YOU have a sore throat, fever, or other symptoms suggestive of strep?"      Sore throat, fever, nausea 5. VIRAL SYMPTOMS: "Are there any symptoms of a cold, such as a runny nose, cough, hoarse voice?"  Protocols used: Strep Throat Exposure-A-AH

## 2022-09-25 NOTE — Progress Notes (Signed)
Subjective:    Patient ID: Willie Randolph, male    DOB: [birth year], 32 y.o.   MRN: [id number]  HPI  Patient presents to clinic today with complaint of headache, nasal congestion and sore throat.  This started 3 days ago.  He is not blowing much mucus out of his nose.  He is having difficulty swallowing.  He has had exposure to strep throat.  He was seen 4/23 for the same.  Rapid strep, COVID and flu were negative at that time.  He is taking Zyrtec and Flonase OTC.  Review of Systems     Past Medical History:  Diagnosis Date   Allergy    Depression    GERD (gastroesophageal reflux disease)    Hypertension    Kidney stones    Migraine     Current Outpatient Medications  Medication Sig Dispense Refill   aspirin EC 81 MG tablet Take 1 tablet (81 mg total) by mouth daily.     atenolol (TENORMIN) 25 MG tablet TAKE 1 TABLET BY MOUTH ONCE DAILY 90 tablet 1   atorvastatin (LIPITOR) 20 MG tablet TAKE 1 TABLET BY MOUTH ONCE DAILY 90 tablet 1   Cariprazine HCl (VRAYLAR PO) Take by mouth.     escitalopram (LEXAPRO) 20 MG tablet Take 1 tablet (20 mg total) by mouth daily. 90 tablet 3   lisinopril (ZESTRIL) 10 MG tablet TAKE 1 TABLET BY MOUTH ONCE DAILY 90 tablet 0   montelukast (SINGULAIR) 10 MG tablet TAKE 1 TABLET BY MOUTH AT BEDTIME 90 tablet 1   NURTEC 75 MG TBDP TAKE 1 TABLET ON OR UNDER THE TONGUE EVERY OTHER DAY. FOR HEADACHE PREVENTION. IF DEVELOP HEADACHE ON DAY BETWEEN DOSE MAY TAKE ON THAT DAY AND SKIP NEXT. MAX 1 TABLET IN 24 HOURS. 16 tablet 5   ondansetron (ZOFRAN-ODT) 4 MG disintegrating tablet Take 1 tablet (4 mg total) by mouth every 8 (eight) hours as needed for nausea or vomiting. 30 tablet 0   rizatriptan (MAXALT-MLT) 10 MG disintegrating tablet DISSOLVE 1 TABLET ON THE TONGUE AS NEEDED FOR MIGRAINE. MAY REPEAT IN 2 HOURS IF NEEDED 10 tablet 2   No current facility-administered medications for this visit.    No Known Allergies  Family History  Problem Relation Age of  Onset   Depression Mother    Heart disease Father    Diabetes Father    Depression Father    Depression Brother    Heart attack Maternal Grandfather 65   COPD Paternal Grandmother 69   Heart attack Paternal Grandfather 40    Social History   Socioeconomic History   Marital status: Married    Spouse name: [place]   Number of children: 1   Years of education: Not on file   Highest education level: Master's degree (e.g., MA, MS, MEng, MEd, MSW, MBA)  Occupational History   Not on file  Tobacco Use   Smoking status: Never   Smokeless tobacco: Never  Substance and Sexual Activity   Alcohol use: Yes    Comment: occ   Drug use: No   Sexual activity: Not on file  Other Topics Concern   Not on file  Social History Narrative   Lives with wife   Caffeine- sodas 2-3 a day   Social Determinants of Health   Financial Resource Strain: Low Risk  (09/22/2022)   Overall Financial Resource Strain (CARDIA)    Difficulty of Paying Living Expenses: Not hard at all  Food Insecurity: No Food Insecurity (09/22/2022)  Hunger Vital Sign    Worried About Running Out of Food in the Last Year: Never true    Ran Out of Food in the Last Year: Never true  Transportation Needs: No Transportation Needs (09/22/2022)   PRAPARE - Administrator, Civil Service (Medical): No    Lack of Transportation (Non-Medical): No  Physical Activity: Sufficiently Active (09/22/2022)   Exercise Vital Sign    Days of Exercise per Week: 5 days    Minutes of Exercise per Session: 60 min  Stress: No Stress Concern Present (09/22/2022)   [employer] of Occupational Health - Occupational Stress Questionnaire    Feeling of Stress : Only a little  Social Connections: Moderately Isolated (09/22/2022)   Social Connection and Isolation Panel [NHANES]    Frequency of Communication with Friends and Family: Twice a week    Frequency of Social Gatherings with Friends and Family: Once a week    Attends Religious  Services: Never    Database administrator or Organizations: No    Attends Engineer, structural: Not on file    Marital Status: Married  Catering manager Violence: Not on file     Constitutional: Patient reports headache.  Denies fever, malaise, fatigue, or abrupt weight changes.  HEENT: Patient reports nasal congestion and sore throat.  Denies eye pain, eye redness, ear pain, ringing in the ears, wax buildup, runny nose, bloody nose. Respiratory: Denies difficulty breathing, shortness of breath, cough or sputum production.   Cardiovascular: Denies chest pain, chest tightness, palpitations or swelling in the hands or feet.  Gastrointestinal: Denies abdominal pain, bloating, constipation, diarrhea or blood in the stool.   No other specific complaints in a complete review of systems (except as listed in HPI above).  Objective:   Physical Exam  BP 136/82 (BP Location: Left Arm, Patient Position: Sitting, Cuff Size: Large)   Pulse 96   Temp (!) 96.6 F (35.9 C) (Temporal)   Wt 241 lb (109.3 kg)   SpO2 96%   BMI 38.90 kg/m   Wt Readings from Last 3 Encounters:  09/22/22 242 lb (109.8 kg)  05/22/22 250 lb (113.4 kg)  09/12/21 242 lb 9.6 oz (110 kg)    General: Appears his stated age, obese, in NAD. Skin: Warm, dry and intact. No rashes noted. HEENT: Head: normal shape and size; Eyes: sclera white, no icterus, conjunctiva pink, PERRLA and EOMs intact; Nose: mucosa pink and moist, septum midline; Throat/Mouth: Teeth present, mucosa pink and moist, + PND, no exudate, lesions or ulcerations noted.  Neck: No adenopathy noted but tender with palpation. Cardiovascular: Normal rate and rhythm. S1,S2 noted.  No murmur, rubs or gallops noted.  Pulmonary/Chest: Normal effort and positive vesicular breath sounds. No respiratory distress. No wheezes, rales or ronchi noted.  Neurological: Alert and oriented.    BMET    Component Value Date/Time   NA 142 09/09/2021 0837   NA 138  09/05/2019 0000   K 4.3 09/09/2021 0837   CL 110 09/09/2021 0837   CO2 21 09/09/2021 0837   GLUCOSE 94 09/09/2021 0837   BUN 15 09/09/2021 0837   BUN 14 09/05/2019 0000   CREATININE 1.00 09/09/2021 0837   CALCIUM 9.5 09/09/2021 0837   GFRNONAA 92 09/05/2019 0000   GFRAA >60 01/15/2015 0229    Lipid Panel     Component Value Date/Time   CHOL 153 09/09/2021 0837   TRIG 258 (H) 09/09/2021 0837   HDL 44 09/09/2021 0837   CHOLHDL  3.5 09/09/2021 0837   LDLCALC 75 09/09/2021 0837    CBC    Component Value Date/Time   WBC 5.6 09/09/2021 0837   RBC 4.93 09/09/2021 0837   HGB 14.8 09/09/2021 0837   HCT 43.8 09/09/2021 0837   PLT 262 09/09/2021 0837   MCV 88.8 09/09/2021 0837   MCH 30.0 09/09/2021 0837   MCHC 33.8 09/09/2021 0837   RDW 12.2 09/09/2021 0837   LYMPHSABS 1,842 09/09/2021 0837   MONOABS 0.5 01/09/2015 1429   EOSABS 39 09/09/2021 0837   BASOSABS 28 09/09/2021 0837    Hgb A1C Lab Results  Component Value Date   HGBA1C 5.4 07/22/2020           Assessment & Plan:   Acute Headache, Nasal Congestion, Sore Throat, Exposure to Strep:  Repeat strep was again negative Continue Zyrtec and Flonase OTC Rx for Pred 10 mg daily x 5 days for symptom management Rx for Augmentin 875-125 mg to start over the weekend if symptoms worsen Encourage rest and fluids  Follow-up with your PCP as previously scheduled Nicki Reaper, NP

## 2022-09-25 NOTE — Patient Instructions (Signed)
Sore Throat A sore throat is pain, burning, irritation, or scratchiness in the throat. When you have a sore throat, you may feel pain or tenderness in your throat when you swallow or talk. Many things can cause a sore throat, including: An infection. Seasonal allergies. Dryness in the air. Irritants, such as smoke or pollution. Radiation treatment for cancer. Gastroesophageal reflux disease (GERD). A tumor. A sore throat is often the first sign of another sickness. It may happen with other symptoms, such as coughing, sneezing, fever, and swollen neck glands. Most sore throats go away without medical treatment. Follow these instructions at home:     Medicines Take over-the-counter and prescription medicines only as told by your health care provider. Children often get sore throats. Do not give your child aspirin because of the association with Reye's syndrome. Use throat sprays to soothe your throat as told by your health care provider. Managing pain To help with pain, try: Sipping warm liquids, such as broth, herbal tea, or warm water. Eating or drinking cold or frozen liquids, such as frozen ice pops. Gargling with a mixture of salt and water 3-4 times a day or as needed. To make salt water, completely dissolve -1 tsp (3-6 g) of salt in 1 cup (237 mL) of warm water. Sucking on hard candy or throat lozenges. Putting a cool-mist humidifier in your bedroom at night to moisten the air. Sitting in the bathroom with the door closed for 5-10 minutes while you run hot water in the shower. General instructions Do not use any products that contain nicotine or tobacco. These products include cigarettes, chewing tobacco, and vaping devices, such as e-cigarettes. If you need help quitting, ask your health care provider. Rest as needed. Drink enough fluid to keep your urine pale yellow. Wash your hands often with soap and water for at least 20 seconds. If soap and water are not available, use hand  sanitizer. Contact a health care provider if: You have a fever for more than 2-3 days. You have symptoms that last for more than 2-3 days. Your throat does not get better within 7 days. You have a fever and your symptoms suddenly get worse. Get help right away if: You have difficulty breathing. You cannot swallow fluids, soft foods, or your saliva. You have increased swelling in your throat or neck. You have persistent nausea and vomiting. These symptoms may represent a serious problem that is an emergency. Do not wait to see if the symptoms will go away. Get medical help right away. Call your local emergency services (911 in the [place] Do not drive yourself to the hospital. Summary A sore throat is pain, burning, irritation, or scratchiness in the throat. Many things can cause a sore throat. Take over-the-counter medicines only as told by your health care provider. Rest as needed. Drink enough fluid to keep your urine pale yellow. Contact a health care provider if your throat does not get better within 7 days. This information is not intended to replace advice given to you by your health care provider. Make sure you discuss any questions you have with your health care provider. Document Revised: 08/14/2020 Document Reviewed: 08/14/2020 Elsevier Patient Education  2023 Elsevier Inc.  

## 2022-11-10 ENCOUNTER — Encounter: Payer: Self-pay | Admitting: Family Medicine

## 2022-11-10 DIAGNOSIS — J3089 Other allergic rhinitis: Secondary | ICD-10-CM

## 2022-11-10 DIAGNOSIS — E785 Hyperlipidemia, unspecified: Secondary | ICD-10-CM

## 2022-11-11 ENCOUNTER — Telehealth: Payer: Self-pay

## 2022-11-11 ENCOUNTER — Other Ambulatory Visit: Payer: Self-pay | Admitting: Family Medicine

## 2022-11-11 DIAGNOSIS — R7309 Other abnormal glucose: Secondary | ICD-10-CM

## 2022-11-11 DIAGNOSIS — F331 Major depressive disorder, recurrent, moderate: Secondary | ICD-10-CM

## 2022-11-11 DIAGNOSIS — E782 Mixed hyperlipidemia: Secondary | ICD-10-CM

## 2022-11-11 DIAGNOSIS — Z0000 Encounter for general adult medical examination without abnormal findings: Secondary | ICD-10-CM

## 2022-11-11 MED ORDER — MONTELUKAST SODIUM 10 MG PO TABS: 10.0000 mg | ORAL_TABLET | Freq: Every day | ORAL | 0 refills | Status: DC

## 2022-11-11 MED ORDER — ATORVASTATIN CALCIUM 20 MG PO TABS: 20.0000 mg | ORAL_TABLET | Freq: Every day | ORAL | 0 refills | Status: DC

## 2022-11-11 NOTE — Telephone Encounter (Signed)
Please schedule him for fasting lab only apt 1st week of July. Prior to his upcoming physical scheduled already for 12/08/22.  I have signed his Future lab Orders, and they are ready on the chart.  Saralyn Pilar, DO [facility] 11/11/2022, 5:59 PM

## 2022-11-11 NOTE — Telephone Encounter (Signed)
Copied from CRM [id number]. Topic: General - Inquiry >> Nov 11, 2022  8:43 AM De Blanch wrote: Reason for CRM:Pt is requesting labs for upcoming physical appointment 07/09.  No orders.  Please advise.

## 2022-11-12 NOTE — Telephone Encounter (Signed)
Lab appt scheduled.

## 2022-11-16 ENCOUNTER — Other Ambulatory Visit: Payer: Self-pay

## 2022-11-16 DIAGNOSIS — G43011 Migraine without aura, intractable, with status migrainosus: Secondary | ICD-10-CM

## 2022-11-16 MED ORDER — NURTEC 75 MG PO TBDP
ORAL_TABLET | ORAL | 5 refills | Status: DC
Start: 2022-11-16 — End: 2022-12-08

## 2022-11-27 ENCOUNTER — Other Ambulatory Visit: Payer: BC Managed Care – PPO

## 2022-11-27 DIAGNOSIS — E782 Mixed hyperlipidemia: Secondary | ICD-10-CM

## 2022-11-27 DIAGNOSIS — Z0000 Encounter for general adult medical examination without abnormal findings: Secondary | ICD-10-CM

## 2022-11-27 DIAGNOSIS — R7309 Other abnormal glucose: Secondary | ICD-10-CM

## 2022-11-27 LAB — CBC WITH DIFFERENTIAL/PLATELET
Absolute Monocytes: 350 cells/uL (ref 200–950)
Hemoglobin: 14.3 g/dL (ref 13.2–17.1)
MCHC: 33.6 g/dL (ref 32.0–36.0)
MPV: 10.5 fL (ref 7.5–12.5)
RBC: 4.74 10*6/uL (ref 4.20–5.80)
WBC: 4.8 10*3/uL (ref 3.8–10.8)

## 2022-11-28 LAB — CBC WITH DIFFERENTIAL/PLATELET
Basophils Absolute: 19 cells/uL (ref 0–200)
Basophils Relative: 0.4 %
Eosinophils Absolute: 19 cells/uL (ref 15–500)
Eosinophils Relative: 0.4 %
HCT: 42.6 % (ref 38.5–50.0)
Lymphs Abs: 1685 cells/uL (ref 850–3900)
MCH: 30.2 pg (ref 27.0–33.0)
MCV: 89.9 fL (ref 80.0–100.0)
Monocytes Relative: 7.3 %
Neutro Abs: 2726 cells/uL (ref 1500–7800)
Neutrophils Relative %: 56.8 %
Platelets: 257 10*3/uL (ref 140–400)
RDW: 12.2 % (ref 11.0–15.0)
Total Lymphocyte: 35.1 %

## 2022-11-28 LAB — COMPLETE METABOLIC PANEL WITH GFR
AG Ratio: 2 (calc) (ref 1.0–2.5)
ALT: 30 U/L (ref 9–46)
AST: 15 U/L (ref 10–40)
Albumin: 4.5 g/dL (ref 3.6–5.1)
Alkaline phosphatase (APISO): 90 U/L (ref 36–130)
BUN: 16 mg/dL (ref 7–25)
CO2: 24 mmol/L (ref 20–32)
Calcium: 9.7 mg/dL (ref 8.6–10.3)
Chloride: 108 mmol/L (ref 98–110)
Creat: 0.97 mg/dL (ref 0.60–1.26)
Globulin: 2.2 g/dL (calc) (ref 1.9–3.7)
Glucose, Bld: 94 mg/dL (ref 65–99)
Potassium: 4.4 mmol/L (ref 3.5–5.3)
Sodium: 143 mmol/L (ref 135–146)
Total Bilirubin: 0.3 mg/dL (ref 0.2–1.2)
Total Protein: 6.7 g/dL (ref 6.1–8.1)
eGFR: 106 mL/min/{1.73_m2} (ref >=60)

## 2022-11-28 LAB — LIPID PANEL
Cholesterol: 141 mg/dL (ref <200)
HDL: 40 mg/dL (ref >=40)
LDL Cholesterol (Calc): 73 mg/dL (calc)
Non-HDL Cholesterol (Calc): 101 mg/dL (calc) (ref <130)
Total CHOL/HDL Ratio: 3.5 (calc) (ref <5.0)
Triglycerides: 190 mg/dL — ABNORMAL HIGH (ref <150)

## 2022-11-28 LAB — HEMOGLOBIN A1C
Hgb A1c MFr Bld: 5.5 % of total Hgb (ref <5.7)
Mean Plasma Glucose: 111 mg/dL
eAG (mmol/L): 6.2 mmol/L

## 2022-11-28 LAB — TSH: TSH: 2.08 mIU/L (ref 0.40–4.50)

## 2022-12-08 ENCOUNTER — Ambulatory Visit (INDEPENDENT_AMBULATORY_CARE_PROVIDER_SITE_OTHER): Payer: BC Managed Care – PPO | Admitting: Family Medicine

## 2022-12-08 ENCOUNTER — Encounter: Payer: Self-pay | Admitting: Family Medicine

## 2022-12-08 VITALS — BP 124/70 | HR 97 | Temp 99.1°F | Resp 18 | Ht 66.0 in | Wt 245.0 lb

## 2022-12-08 DIAGNOSIS — J3089 Other allergic rhinitis: Secondary | ICD-10-CM

## 2022-12-08 DIAGNOSIS — F411 Generalized anxiety disorder: Secondary | ICD-10-CM

## 2022-12-08 DIAGNOSIS — E785 Hyperlipidemia, unspecified: Secondary | ICD-10-CM

## 2022-12-08 DIAGNOSIS — Z0000 Encounter for general adult medical examination without abnormal findings: Secondary | ICD-10-CM | POA: Diagnosis not present

## 2022-12-08 DIAGNOSIS — I10 Essential (primary) hypertension: Secondary | ICD-10-CM | POA: Diagnosis not present

## 2022-12-08 DIAGNOSIS — F3177 Bipolar disorder, in partial remission, most recent episode mixed: Secondary | ICD-10-CM

## 2022-12-08 DIAGNOSIS — G43909 Migraine, unspecified, not intractable, without status migrainosus: Secondary | ICD-10-CM

## 2022-12-08 MED ORDER — ATENOLOL 25 MG PO TABS: 25.0000 mg | ORAL_TABLET | Freq: Every day | ORAL | 3 refills | Status: DC

## 2022-12-08 MED ORDER — LISINOPRIL 10 MG PO TABS: 10.0000 mg | ORAL_TABLET | Freq: Every day | ORAL | 3 refills | Status: DC

## 2022-12-08 MED ORDER — ATORVASTATIN CALCIUM 20 MG PO TABS: 20.0000 mg | ORAL_TABLET | Freq: Every day | ORAL | 3 refills | Status: DC

## 2022-12-08 MED ORDER — MONTELUKAST SODIUM 10 MG PO TABS: 10.0000 mg | ORAL_TABLET | Freq: Every day | ORAL | 3 refills | Status: DC

## 2022-12-08 NOTE — Progress Notes (Unsigned)
Subjective:    Patient ID: Willie Randolph, male    DOB: 04-28-1991, 32 y.o.   MRN: [id number]  Willie Randolph is a 32 y.o. male presenting on 12/08/2022 for Annual Exam   HPI  Here for Annual Physical  Bipolar disorder Generalized Anxiety GAD  Followed by psychiatry, has come off of Lexapro, and ultimately has been transitioned to [employer] 1.5mg  daily Followed by Psychiatry [facility] Upcoming anxiety management per Psychiatry in future. Off Lexapro On Vraylar  CHRONIC HTN: Morbid Obesity BMI >39 Reports chronic history of HTN, has been controlled on HTN. Current Meds: -  Atenolol 25mg  daily, Lisinopril 10mg  daily - OFF Dyazide (triamterene-HCTZ) 37.5-25mg  daily   Reports good compliance, took meds today. Tolerating well, w/o complaints. Denies CP, dyspnea, HA, edema, dizziness / lightheadedness  HYPERLIPIDEMIA: Hypertriglyceridemia Last result with TG 190, improved from 230s, LDL 70s - Currently taking Atorvastatin 20mg  daily, tolerating well without side effects or myalgias - Not taking Omega 3 fish oil but may try.   GERD Taking Famotidine 20mg  BID     Migraine Headaches, episodic Nausea Prior chronic history, usually migraines only 1-2 times a year  Resolved migraines, no migraine in >6 months. Since he has been on Vraylar He is OFF Nurtec, Maxalt, Zofran   Health Maintenance  No additional testing due.     12/08/2022    3:53 PM 05/22/2022   11:36 AM 09/12/2021   10:37 AM  Depression screen PHQ 2/9  Decreased Interest 1 2 1   Down, Depressed, Hopeless 1 3 1   PHQ - 2 Score 2 5 2   Altered sleeping 2 1 0  Tired, decreased energy 2 2 2   Change in appetite 0 2 1  Feeling bad or failure about yourself  1 3 1   Trouble concentrating 1 3 1   Moving slowly or fidgety/restless 1 1 1   Suicidal thoughts 0 2 0  PHQ-9 Score 9 19 8   Difficult doing work/chores Somewhat difficult Very difficult Somewhat difficult    Past Medical History:  Diagnosis Date    Allergy    Depression    GERD (gastroesophageal reflux disease)    Hypertension    Kidney stones    Migraine    Past Surgical History:  Procedure Laterality Date   KIDNEY STONE SURGERY     LITHOTRIPSY     Social History   Socioeconomic History   Marital status: Married    Spouse name: [place]   Number of children: 1   Years of education: Not on file   Highest education level: Master's degree (e.g., MA, MS, MEng, MEd, MSW, MBA)  Occupational History   Not on file  Tobacco Use   Smoking status: Never   Smokeless tobacco: Never  Vaping Use   Vaping Use: Never used  Substance and Sexual Activity   Alcohol use: Yes    Comment: occ   Drug use: Not Currently   Sexual activity: Not on file  Other Topics Concern   Not on file  Social History Narrative   Lives with wife   Caffeine- sodas 2-3 a day   Social Determinants of Health   Financial Resource Strain: Low Risk  (09/22/2022)   Overall Financial Resource Strain (CARDIA)    Difficulty of Paying Living Expenses: Not hard at all  Food Insecurity: No Food Insecurity (09/22/2022)   Hunger Vital Sign    Worried About Running Out of Food in the Last Year: Never true    Ran Out of Food in the Last Year:  Never true  Transportation Needs: No Transportation Needs (09/22/2022)   PRAPARE - Administrator, Civil Service (Medical): No    Lack of Transportation (Non-Medical): No  Physical Activity: Sufficiently Active (09/22/2022)   Exercise Vital Sign    Days of Exercise per Week: 5 days    Minutes of Exercise per Session: 60 min  Stress: No Stress Concern Present (09/22/2022)   [employer] of Occupational Health - Occupational Stress Questionnaire    Feeling of Stress : Only a little  Social Connections: Moderately Isolated (09/22/2022)   Social Connection and Isolation Panel [NHANES]    Frequency of Communication with Friends and Family: Twice a week    Frequency of Social Gatherings with Friends and Family: Once  a week    Attends Religious Services: Never    Database administrator or Organizations: No    Attends Engineer, structural: Not on file    Marital Status: Married  Catering manager Violence: Not on file   Family History  Problem Relation Age of Onset   Depression Mother    Heart disease Father    Diabetes Father    Depression Father    Depression Brother    Heart attack Maternal Grandfather 37   COPD Paternal Grandmother 50   Heart attack Paternal Grandfather 50   Current Outpatient Medications on File Prior to Visit  Medication Sig   aspirin EC 81 MG tablet Take 1 tablet (81 mg total) by mouth daily.   VRAYLAR 1.5 MG capsule Take 1.5 mg by mouth daily.   No current facility-administered medications on file prior to visit.    Review of Systems  Constitutional:  Negative for activity change, appetite change, chills, diaphoresis, fatigue and fever.  HENT:  Negative for congestion and hearing loss.   Eyes:  Negative for visual disturbance.  Respiratory:  Negative for cough, chest tightness, shortness of breath and wheezing.   Cardiovascular:  Negative for chest pain, palpitations and leg swelling.  Gastrointestinal:  Negative for abdominal pain, constipation, diarrhea, nausea and vomiting.  Genitourinary:  Negative for dysuria, frequency and hematuria.  Musculoskeletal:  Negative for arthralgias and neck pain.  Skin:  Negative for rash.  Neurological:  Negative for dizziness, weakness, light-headedness, numbness and headaches.  Hematological:  Negative for adenopathy.  Psychiatric/Behavioral:  Negative for behavioral problems, dysphoric mood and sleep disturbance.    Per HPI unless specifically indicated above     Objective:    BP 124/70 (BP Location: Left Arm, Patient Position: Sitting, Cuff Size: Large)   Pulse 97   Temp 99.1 F (37.3 C) (Oral)   Resp 18   Ht 5\' 6"  (1.676 m)   Wt 245 lb (111.1 kg)   SpO2 97%   BMI 39.54 kg/m   Wt Readings from Last 3  Encounters:  12/08/22 245 lb (111.1 kg)  09/25/22 241 lb (109.3 kg)  09/22/22 242 lb (109.8 kg)    Physical Exam Vitals and nursing note reviewed.  Constitutional:      General: He is not in acute distress.    Appearance: He is well-developed. He is not diaphoretic.     Comments: Well-appearing, comfortable, cooperative  HENT:     Head: Normocephalic and atraumatic.  Eyes:     General:        Right eye: No discharge.        Left eye: No discharge.     Conjunctiva/sclera: Conjunctivae normal.     Pupils: Pupils are equal, round,  and reactive to light.  Neck:     Thyroid: No thyromegaly.  Cardiovascular:     Rate and Rhythm: Normal rate and regular rhythm.     Pulses: Normal pulses.     Heart sounds: Normal heart sounds. No murmur heard. Pulmonary:     Effort: Pulmonary effort is normal. No respiratory distress.     Breath sounds: Normal breath sounds. No wheezing or rales.  Abdominal:     General: Bowel sounds are normal. There is no distension.     Palpations: Abdomen is soft. There is no mass.     Tenderness: There is no abdominal tenderness.  Musculoskeletal:        General: No tenderness. Normal range of motion.     Cervical back: Normal range of motion and neck supple.     Comments: Upper / Lower Extremities: - Normal muscle tone, strength bilateral upper extremities 5/5, lower extremities 5/5  Lymphadenopathy:     Cervical: No cervical adenopathy.  Skin:    General: Skin is warm and dry.     Findings: No erythema or rash.  Neurological:     Mental Status: He is alert and oriented to person, place, and time.     Comments: Distal sensation intact to light touch all extremities  Psychiatric:        Mood and Affect: Mood normal.        Behavior: Behavior normal.        Thought Content: Thought content normal.     Comments: Well groomed, good eye contact, normal speech and thoughts      Results for orders placed or performed in visit on 11/27/22  TSH  Result  Value Ref Range   TSH 2.08 0.40 - 4.50 mIU/L  Hemoglobin A1c  Result Value Ref Range   Hgb A1c MFr Bld 5.5 <5.7 % of total Hgb   Mean Plasma Glucose 111 mg/dL   eAG (mmol/L) 6.2 mmol/L  Lipid panel  Result Value Ref Range   Cholesterol 141 <200 mg/dL   HDL 40 > OR = 40 mg/dL   Triglycerides 161 (H) <150 mg/dL   LDL Cholesterol (Calc) 73 mg/dL (calc)   Total CHOL/HDL Ratio 3.5 <5.0 (calc)   Non-HDL Cholesterol (Calc) 101 <130 mg/dL (calc)  CBC with Differential/Platelet  Result Value Ref Range   WBC 4.8 3.8 - 10.8 Thousand/uL   RBC 4.74 4.20 - 5.80 Million/uL   Hemoglobin 14.3 13.2 - 17.1 g/dL   HCT 09.6 04.5 - 40.9 %   MCV 89.9 80.0 - 100.0 fL   MCH 30.2 27.0 - 33.0 pg   MCHC 33.6 32.0 - 36.0 g/dL   RDW 81.1 91.4 - 78.2 %   Platelets 257 140 - 400 Thousand/uL   MPV 10.5 7.5 - 12.5 fL   Neutro Abs 2,726 1,500 - 7,800 cells/uL   Lymphs Abs 1,685 850 - 3,900 cells/uL   Absolute Monocytes 350 200 - 950 cells/uL   Eosinophils Absolute 19 15 - 500 cells/uL   Basophils Absolute 19 0 - 200 cells/uL   Neutrophils Relative % 56.8 %   Total Lymphocyte 35.1 %   Monocytes Relative 7.3 %   Eosinophils Relative 0.4 %   Basophils Relative 0.4 %  COMPLETE METABOLIC PANEL WITH GFR  Result Value Ref Range   Glucose, Bld 94 65 - 99 mg/dL   BUN 16 7 - 25 mg/dL   Creat 9.56 2.13 - 0.86 mg/dL   eGFR 578 > OR = 60 IO/NGE/9.52W4  BUN/Creatinine Ratio SEE NOTE: 6 - 22 (calc)   Sodium 143 135 - 146 mmol/L   Potassium 4.4 3.5 - 5.3 mmol/L   Chloride 108 98 - 110 mmol/L   CO2 24 20 - 32 mmol/L   Calcium 9.7 8.6 - 10.3 mg/dL   Total Protein 6.7 6.1 - 8.1 g/dL   Albumin 4.5 3.6 - 5.1 g/dL   Globulin 2.2 1.9 - 3.7 g/dL (calc)   AG Ratio 2.0 1.0 - 2.5 (calc)   Total Bilirubin 0.3 0.2 - 1.2 mg/dL   Alkaline phosphatase (APISO) 90 36 - 130 U/L   AST 15 10 - 40 U/L   ALT 30 9 - 46 U/L      Assessment & Plan:   Problem List Items Addressed This Visit     Bipolar disorder (HCC)    Followed  by Psychiatry at [facility] Improved on med management and therapy Vraylar      Environmental and seasonal allergies    Re order Singulair      Relevant Medications   montelukast (SINGULAIR) 10 MG tablet   Episodic migraine    Seems resolved since bipolar now under control On vraylar  Now off Nurtec, Rizatriptan and other medications      Relevant Medications   lisinopril (ZESTRIL) 10 MG tablet   atorvastatin (LIPITOR) 20 MG tablet   atenolol (TENORMIN) 25 MG tablet   Essential hypertension    Well-controlled HTN No known complications  - Off Triamterene-HCTZ   Plan:  1. Continue current BP regimen -  Atenolol 25mg  daily, Lisinopril 10mg  daily 2. Encourage improved lifestyle - low sodium diet, regular exercise 3. Continue monitor BP outside office, bring readings to next visit, if persistently >140/90 or new symptoms notify office sooner      Relevant Medications   lisinopril (ZESTRIL) 10 MG tablet   atorvastatin (LIPITOR) 20 MG tablet   atenolol (TENORMIN) 25 MG tablet   GAD (generalized anxiety disorder)   Other Visit Diagnoses     Annual physical exam    -  Primary   Hyperlipidemia, unspecified hyperlipidemia type       Relevant Medications   lisinopril (ZESTRIL) 10 MG tablet   atorvastatin (LIPITOR) 20 MG tablet   atenolol (TENORMIN) 25 MG tablet       Meds ordered this encounter  Medications   montelukast (SINGULAIR) 10 MG tablet    Sig: Take 1 tablet (10 mg total) by mouth at bedtime.    Dispense:  90 tablet    Refill:  3   lisinopril (ZESTRIL) 10 MG tablet    Sig: Take 1 tablet (10 mg total) by mouth daily.    Dispense:  90 tablet    Refill:  3   atorvastatin (LIPITOR) 20 MG tablet    Sig: Take 1 tablet (20 mg total) by mouth daily.    Dispense:  90 tablet    Refill:  3   atenolol (TENORMIN) 25 MG tablet    Sig: Take 1 tablet (25 mg total) by mouth daily.    Dispense:  90 tablet    Refill:  3      Follow up plan: Return in about 1  year (around 12/08/2023) for 1 year fasting lab then 1 week later Annual Physical.  Saralyn Pilar, DO [facility] 12/08/2022, 4:04 PM

## 2022-12-08 NOTE — Patient Instructions (Addendum)
Thank you for coming to the office today.  Keep up the great work Refilled all medications, 90 day with refills  DUE for FASTING BLOOD WORK (no food or drink after midnight before the lab appointment, only water or coffee without cream/sugar on the morning of)  SCHEDULE "Lab Only" visit in the morning at the clinic for lab draw in 1 YEAR  - Make sure Lab Only appointment is at about 1 week before your next appointment, so that results will be available  For Lab Results, once available within 2-3 days of blood draw, you can can log in to MyChart online to view your results and a brief explanation. Also, we can discuss results at next follow-up visit.   Please schedule a Follow-up Appointment to: Return in about 1 year (around 12/08/2023) for 1 year fasting lab then 1 week later Annual Physical.  If you have any other questions or concerns, please feel free to call the office or send a message through MyChart. You may also schedule an earlier appointment if necessary.  Additionally, you may be receiving a survey about your experience at our office within a few days to 1 week by e-mail or mail. We value your feedback.  Saralyn Pilar, DO [facility], [place]

## 2022-12-09 NOTE — Assessment & Plan Note (Signed)
Re order Singulair

## 2022-12-09 NOTE — Assessment & Plan Note (Signed)
Seems resolved since bipolar now under control On vraylar  Now off Nurtec, Rizatriptan and other medications

## 2022-12-09 NOTE — Assessment & Plan Note (Signed)
Followed by Psychiatry at [facility] Improved on med management and therapy Vraylar

## 2022-12-09 NOTE — Assessment & Plan Note (Signed)
Well-controlled HTN ?No known complications  ?- Off Triamterene-HCTZ ?  ?Plan:  ?1. Continue current BP regimen -  Atenolol 25mg daily, Lisinopril 10mg daily ?2. Encourage improved lifestyle - low sodium diet, regular exercise ?3. Continue monitor BP outside office, bring readings to next visit, if persistently >140/90 or new symptoms notify office sooner ?

## 2023-02-13 ENCOUNTER — Telehealth: Payer: Self-pay | Admitting: Physician Assistant

## 2023-02-13 DIAGNOSIS — J208 Acute bronchitis due to other specified organisms: Secondary | ICD-10-CM

## 2023-02-13 DIAGNOSIS — B9689 Other specified bacterial agents as the cause of diseases classified elsewhere: Secondary | ICD-10-CM

## 2023-02-13 MED ORDER — ALBUTEROL SULFATE HFA 108 (90 BASE) MCG/ACT IN AERS: 2.0000 | INHALATION_SPRAY | Freq: Four times a day (QID) | RESPIRATORY_TRACT | 0 refills | Status: DC | PRN

## 2023-02-13 MED ORDER — AZITHROMYCIN 250 MG PO TABS
ORAL_TABLET | ORAL | 0 refills | Status: AC
Start: 2023-02-13 — End: 2023-02-18

## 2023-02-13 MED ORDER — BENZONATATE 100 MG PO CAPS
100.0000 mg | ORAL_CAPSULE | Freq: Three times a day (TID) | ORAL | 0 refills | Status: DC | PRN
Start: 2023-02-13 — End: 2023-12-09

## 2023-02-13 NOTE — Patient Instructions (Signed)
[facility], thank you for joining Willie Climes, PA-C for today's virtual visit.  While this provider is not your primary [facility] provider (PCP), if your PCP is located in our provider database this encounter information will be shared with them immediately following your visit.   A [facility] account gives you access to today's visit and all your visits, tests, and labs performed at [facility] " click here if you don't have a [facility] account or go to [email or web address]  Consent: (Patient) Willie Randolph provided verbal consent for this virtual visit at the beginning of the encounter.  Current Medications:  Current Outpatient Medications:    aspirin EC 81 MG tablet, Take 1 tablet (81 mg total) by mouth daily., Disp: , Rfl:    atenolol (TENORMIN) 25 MG tablet, Take 1 tablet (25 mg total) by mouth daily., Disp: 90 tablet, Rfl: 3   atorvastatin (LIPITOR) 20 MG tablet, Take 1 tablet (20 mg total) by mouth daily., Disp: 90 tablet, Rfl: 3   lisinopril (ZESTRIL) 10 MG tablet, Take 1 tablet (10 mg total) by mouth daily., Disp: 90 tablet, Rfl: 3   montelukast (SINGULAIR) 10 MG tablet, Take 1 tablet (10 mg total) by mouth at bedtime., Disp: 90 tablet, Rfl: 3   VRAYLAR 1.5 MG capsule, Take 1.5 mg by mouth daily., Disp: , Rfl:    Medications ordered in this encounter:  No orders of the defined types were placed in this encounter.  *If you need refills on other medications prior to your next appointment, please contact your pharmacy*  Follow-Up: Call back or seek an in-person evaluation if the symptoms worsen or if the condition fails to improve as anticipated.  [facility] [phone]  Other Instructions Take antibiotic (Azithromycin) as directed.  Increase fluids.  Get plenty of rest. Use Mucinex for congestion. Use the Tessalon and albuterol as directed. Take a daily probiotic (I recommend Align or Culturelle, but even Activia Yogurt  may be beneficial).  A humidifier placed in the bedroom may offer some relief for a dry, scratchy throat of nasal irritation.  Read information below on acute bronchitis. Please call or return to clinic if symptoms are not improving.  Acute Bronchitis Bronchitis is when the airways that extend from the windpipe into the lungs get red, puffy, and painful (inflamed). Bronchitis often causes thick spit (mucus) to develop. This leads to a cough. A cough is the most common symptom of bronchitis. In acute bronchitis, the condition usually begins suddenly and goes away over time (usually in 2 weeks). Smoking, allergies, and asthma can make bronchitis worse. Repeated episodes of bronchitis may cause more lung problems.  HOME [facility] Rest. Drink enough fluids to keep your pee (urine) clear or pale yellow (unless you need to limit fluids as told by your doctor). Only take over-the-counter or prescription medicines as told by your doctor. Avoid smoking and secondhand smoke. These can make bronchitis worse. If you are a smoker, think about using nicotine gum or skin patches. Quitting smoking will help your lungs heal faster. Reduce the chance of getting bronchitis again by: Washing your hands often. Avoiding people with cold symptoms. Trying not to touch your hands to your mouth, nose, or eyes. Follow up with your doctor as told.  GET HELP IF: Your symptoms do not improve after 1 week of treatment. Symptoms include: Cough. Fever. Coughing up thick spit. Body aches. Chest congestion. Chills. Shortness of breath. Sore throat.  GET HELP RIGHT AWAY IF:  You have an increased fever. You have chills. You have severe shortness of breath. You have bloody thick spit (sputum). You throw up (vomit) often. You lose too much body fluid (dehydration). You have a severe headache. You faint.  MAKE SURE YOU:  Understand these instructions. Will watch your condition. Will get help right away if you are not  doing well or get worse. Document Released: 11/04/2007 Document Revised: 01/18/2013 Document Reviewed: 11/08/2012 [facility] Patient Information 2015 [facility]. This information is not intended to replace advice given to you by your health [facility] provider. Make sure you discuss any questions you have with your health [facility] provider.  If you have been instructed to have an in-person evaluation today at a local Urgent [facility] facility, please use the link below. It will take you to a list of all of our available [facility], including address, phone number and hours of operation. Please do not delay Randolph.  [facility]  If you or a family member do not have a primary [facility] provider, use the link below to schedule a visit and establish [facility]. When you choose a [place] primary [facility] physician or advanced practice provider, you gain a long-term partner in health. Find a Primary [facility] Provider  Learn more about [facility]'s in-office and virtual [facility] options:  - Get [facility] Now

## 2023-02-13 NOTE — Progress Notes (Signed)
Virtual Visit Consent   Ala Desautel, you are scheduled for a virtual visit with a [facility] provider today. Just as with appointments in the office, your consent must be obtained to participate. Your consent will be active for this visit and any virtual visit you may have with one of our providers in the next 365 days. If you have a MyChart account, a copy of this consent can be sent to you electronically.  As this is a virtual visit, video technology does not allow for your provider to perform a traditional examination. This may limit your provider's ability to fully assess your condition. If your provider identifies any concerns that need to be evaluated in person or the need to arrange testing (such as labs, EKG, etc.), we will make arrangements to do so. Although advances in technology are sophisticated, we cannot ensure that it will always work on either your end or our end. If the connection with a video visit is poor, the visit may have to be switched to a telephone visit. With either a video or telephone visit, we are not always able to ensure that we have a secure connection.  By engaging in this virtual visit, you consent to the provision of healthcare and authorize for your insurance to be billed (if applicable) for the services provided during this visit. Depending on your insurance coverage, you may receive a charge related to this service.  I need to obtain your verbal consent now. Are you willing to proceed with your visit today? Levelle Balsamo has provided verbal consent on 02/13/2023 for a virtual visit (video or telephone). Piedad Climes, [place]  Date: 02/13/2023 10:21 AM  Virtual Visit via Video Note   I, Piedad Climes, connected with  Starling Schumacker  ([id number], 10/25/1990) on 02/13/23 at 10:15 AM EDT by a video-enabled telemedicine application and verified that I am speaking with the correct person using two identifiers.  Location: Patient: Virtual Visit Location  Patient: Home Provider: Virtual Visit Location Provider: Home Office   I discussed the limitations of evaluation and management by telemedicine and the availability of in person appointments. The patient expressed understanding and agreed to proceed.    History of Present Illness: Dravon Younkin is a 32 y.o. who identifies as a male who was assigned male at birth, and is being seen today for several days of fever (low-grade), chills, nasal and head congestion with chest tightness and cough that is now productive of thick green phlegm. Some tightness in chest especially at night. Took home COVID test that was negative. 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  OTC -- Cold and cough syrup.  HPI: HPI  Problems:  Patient Active Problem List   Diagnosis Date Noted   ADHD 05/22/2022   Episodic migraine 09/12/2021   Bipolar disorder (HCC) 04/15/2020   GAD (generalized anxiety disorder) 04/15/2020   Essential hypertension 04/15/2020   Morbid obesity (HCC) 04/15/2020   Mixed hyperlipidemia 04/15/2020   Environmental and seasonal allergies 04/15/2020    Allergies: No Known Allergies Medications:  Current Outpatient Medications:    albuterol (VENTOLIN HFA) 108 (90 Base) MCG/ACT inhaler, Inhale 2 puffs into the lungs every 6 (six) hours as needed for wheezing or shortness of breath., Disp: 8 g, Rfl: 0   azithromycin (ZITHROMAX) 250 MG tablet, Take 2 tablets on day 1, then 1 tablet daily on days 2 through 5, Disp: 6 tablet, Rfl: 0  benzonatate (TESSALON) 100 MG capsule, Take 1 capsule (100 mg total) by mouth 3 (three) times daily as needed for cough., Disp: 30 capsule, Rfl: 0   aspirin EC 81 MG tablet, Take 1 tablet (81 mg total) by mouth daily., Disp: , Rfl:    atenolol (TENORMIN) 25 MG tablet, Take 1 tablet (25 mg total) by mouth daily., Disp: 90 tablet, Rfl: 3   atorvastatin (LIPITOR) 20 MG tablet, Take 1 tablet (20 mg total) by  mouth daily., Disp: 90 tablet, Rfl: 3   lisinopril (ZESTRIL) 10 MG tablet, Take 1 tablet (10 mg total) by mouth daily., Disp: 90 tablet, Rfl: 3   montelukast (SINGULAIR) 10 MG tablet, Take 1 tablet (10 mg total) by mouth at bedtime., Disp: 90 tablet, Rfl: 3   VRAYLAR 1.5 MG capsule, Take 1.5 mg by mouth daily., Disp: , Rfl:   Observations/Objective: Patient is well-developed, well-nourished in no acute distress.  Resting comfortably at home.  Head is normocephalic, atraumatic.  No labored breathing.  Speech is clear and coherent with logical content.  Patient is alert and oriented at baseline.    Assessment and Plan: 1. Acute bacterial bronchitis - benzonatate (TESSALON) 100 MG capsule; Take 1 capsule (100 mg total) by mouth 3 (three) times daily as needed for cough.  Dispense: 30 capsule; Refill: 0 - albuterol (VENTOLIN HFA) 108 (90 Base) MCG/ACT inhaler; Inhale 2 puffs into the lungs every 6 (six) hours as needed for wheezing or shortness of breath.  Dispense: 8 g; Refill: 0 - azithromycin (ZITHROMAX) 250 MG tablet; Take 2 tablets on day 1, then 1 tablet daily on days 2 through 5  Dispense: 6 tablet; Refill: 0  Rx Azithromycin.  Increase fluids.  Rest.  Saline nasal spray.  Probiotic.  Mucinex as directed.  Humidifier in bedroom. Tessalon and albuterol per orders.  Call or return to clinic if symptoms are not improving.   Follow Up Instructions: I discussed the assessment and treatment plan with the patient. The patient was provided an opportunity to ask questions and all were answered. The patient agreed with the plan and demonstrated an understanding of the instructions.  A copy of instructions were sent to the patient via MyChart unless otherwise noted below.   The patient was advised to call back or seek an in-person evaluation if the symptoms worsen or if the condition fails to improve as anticipated.  Time:  I spent 10 minutes with the patient via telehealth technology discussing  the above problems/concerns.    Piedad Climes, PA-C

## 2023-02-16 ENCOUNTER — Emergency Department: Payer: BC Managed Care – PPO

## 2023-02-16 ENCOUNTER — Emergency Department
Admission: EM | Admit: 2023-02-16 | Discharge: 2023-02-16 | Disposition: A | Discharge status: Home / Self Care | Payer: BC Managed Care – PPO | Attending: Emergency Medicine | Admitting: Emergency Medicine

## 2023-02-16 ENCOUNTER — Ambulatory Visit: Payer: Self-pay

## 2023-02-16 ENCOUNTER — Other Ambulatory Visit: Payer: Self-pay

## 2023-02-16 DIAGNOSIS — R091 Pleurisy: Secondary | ICD-10-CM | POA: Insufficient documentation

## 2023-02-16 DIAGNOSIS — I10 Essential (primary) hypertension: Secondary | ICD-10-CM | POA: Diagnosis not present

## 2023-02-16 DIAGNOSIS — M940 Chondrocostal junction syndrome [Tietze]: Secondary | ICD-10-CM | POA: Insufficient documentation

## 2023-02-16 DIAGNOSIS — R079 Chest pain, unspecified: Secondary | ICD-10-CM | POA: Diagnosis present

## 2023-02-16 LAB — CBC
HCT: 47.1 % (ref 39.0–52.0)
Hemoglobin: 16.5 g/dL (ref 13.0–17.0)
MCH: 29.8 pg (ref 26.0–34.0)
MCHC: 35 g/dL (ref 30.0–36.0)
MCV: 85.2 fL (ref 80.0–100.0)
Platelets: 371 10*3/uL (ref 150–400)
RBC: 5.53 MIL/uL (ref 4.22–5.81)
RDW: 11.9 % (ref 11.5–15.5)
WBC: 6.1 10*3/uL (ref 4.0–10.5)
nRBC: 0 % (ref 0.0–0.2)

## 2023-02-16 LAB — BASIC METABOLIC PANEL
Anion gap: 11 (ref 5–15)
BUN: 14 mg/dL (ref 6–20)
CO2: 21 mmol/L — ABNORMAL LOW (ref 22–32)
Calcium: 9.6 mg/dL (ref 8.9–10.3)
Chloride: 106 mmol/L (ref 98–111)
Creatinine, Ser: 1.08 mg/dL (ref 0.61–1.24)
GFR, Estimated: >60 mL/min (ref >60)
Glucose, Bld: 116 mg/dL — ABNORMAL HIGH (ref 70–99)
Potassium: 3.7 mmol/L (ref 3.5–5.1)
Sodium: 138 mmol/L (ref 135–145)

## 2023-02-16 LAB — TROPONIN I (HIGH SENSITIVITY): Troponin I (High Sensitivity): 2 ng/L (ref <18)

## 2023-02-16 MED ORDER — ONDANSETRON 8 MG PO TBDP
8.0000 mg | ORAL_TABLET | Freq: Once | ORAL | Status: AC
  Administered 2023-02-16: 8 mg via ORAL
  Filled 2023-02-16: qty 1

## 2023-02-16 MED ORDER — ONDANSETRON 4 MG PO TBDP: 4.0000 mg | ORAL_TABLET | Freq: Three times a day (TID) | ORAL | 0 refills | Status: DC | PRN

## 2023-02-16 MED ORDER — NAPROXEN 500 MG PO TABS: 500.0000 mg | ORAL_TABLET | Freq: Two times a day (BID) | ORAL | 0 refills | Status: DC

## 2023-02-16 MED ORDER — NAPROXEN 500 MG PO TABS
500.0000 mg | ORAL_TABLET | Freq: Once | ORAL | Status: AC
  Administered 2023-02-16: 500 mg via ORAL
  Filled 2023-02-16: qty 1

## 2023-02-16 NOTE — ED Triage Notes (Signed)
Pt comes with c/o cp that stated yesterday. Pt states he did get a zpack for his cough and cold symptoms but it isn't helping.   Pt states he feels like he has a elephant sitting on his chest.

## 2023-02-16 NOTE — Telephone Encounter (Signed)
FYI

## 2023-02-16 NOTE — ED Notes (Signed)
Patient discharged to home per MD order. Patient in stable condition, and deemed medically cleared by ED provider for discharge. Discharge instructions reviewed with patient/family using "Teach Back"; verbalized understanding of medication education and administration, and information about follow-up care. Denies further concerns.

## 2023-02-16 NOTE — Telephone Encounter (Signed)
Chief Complaint: Breathing difficulty, chest pain from coughing Symptoms: Productive cough, fever on and off, Runny nose congestion, pain in chest ribs from cough Frequency: Friday Pertinent Negatives: Patient denies  Disposition: [] ED /[x] Urgent Care (no appt availability in office) / [] Appointment(In office/virtual)/ []  [facility]/ [] Home Care/ [] Refused Recommended Disposition /[] [facility]/ []  Follow-up with PCP Additional Notes: Call transferred with complaint of chest pain "Elephant sitting on chest". Pt states that chest pain occurs from coughing, muscles and ribs hurt from cough. Pt has URI - was seen via VV and given inhaler, tessalon and zpac. Pt continues with cough producing green and clear phlegm. Pt thinks infection has settled lower in lungs. No ov available pt will go to UC for care.    Reason for Disposition  [1] MILD difficulty breathing (e.g., minimal/no SOB at rest, SOB with walking, pulse <100) AND [2] NEW-onset or WORSE than normal  Answer Assessment - Initial Assessment Questions 1. LOCATION: "Where does it hurt?"       Chest - right in the middle 2. RADIATION: "Does the pain go anywhere else?" (e.g., into neck, jaw, arms, back)     *No Answer* 3. ONSET: "When did the chest pain begin?" (Minutes, hours or days)      *No Answer* 4. PATTERN: "Does the pain come and go, or has it been constant since it started?"  "Does it get worse with exertion?"      *No Answer* 5. DURATION: "How long does it last" (e.g., seconds, minutes, hours)     Friday morning 6. SEVERITY: "How bad is the pain?"  (e.g., Scale 1-10; mild, moderate, or severe)    - MILD (1-3): doesn't interfere with normal activities     - MODERATE (4-7): interferes with normal activities or awakens from sleep    - SEVERE (8-10): excruciating pain, unable to do any normal activities       *No Answer* 7. CARDIAC RISK FACTORS: "Do you have any history of heart problems or risk factors for  heart disease?" (e.g., angina, prior heart attack; diabetes, high blood pressure, high cholesterol, smoker, or strong family history of heart disease)     *No Answer* 8. PULMONARY RISK FACTORS: "Do you have any history of lung disease?"  (e.g., blood clots in lung, asthma, emphysema, birth control pills)     *No Answer* 9. CAUSE: "What do you think is causing the chest pain?"     From cough 10. OTHER SYMPTOMS: "Do you have any other symptoms?" (e.g., dizziness, nausea, vomiting, sweating, fever, difficulty breathing, cough)       Cough, nausea & vomiting 11. PREGNANCY: "Is there any chance you are pregnant?" "When was your last menstrual period?"       *No Answer*  Answer Assessment - Initial Assessment Questions 1. RESPIRATORY STATUS: "Describe your breathing?" (e.g., wheezing, shortness of breath, unable to speak, severe coughing)      Shortness of breath - coughing 2. ONSET: "When did this breathing problem begin?"      Friday 3. PATTERN "Does the difficult breathing come and go, or has it been constant since it started?"      constant 4. SEVERITY: "How bad is your breathing?" (e.g., mild, moderate, severe)    - MILD: No SOB at rest, mild SOB with walking, speaks normally in sentences, can lie down, no retractions, pulse < 100.    - MODERATE: SOB at rest, SOB with minimal exertion and prefers to sit, cannot lie down flat, speaks in  phrases, mild retractions, audible wheezing, pulse 100-120.    - SEVERE: Very SOB at rest, speaks in single words, struggling to breathe, sitting hunched forward, retractions, pulse > 120      mild 5. RECURRENT SYMPTOM: "Have you had difficulty breathing before?" If Yes, ask: "When was the last time?" and "What happened that time?"       6. CARDIAC HISTORY: "Do you have any history of heart disease?" (e.g., heart attack, angina, bypass surgery, angioplasty)      no 7. LUNG HISTORY: "Do you have any history of lung disease?"  (e.g., pulmonary embolus, asthma,  emphysema)     no 8. CAUSE: "What do you think is causing the breathing problem?"      URI - bronchitis 9. OTHER SYMPTOMS: "Do you have any other symptoms? (e.g., dizziness, runny nose, cough, chest pain, fever)     Runny nose, congestion, fever on and off, chest pain from coughing  Protocols used: Chest Pain-A-AH, Breathing Difficulty-A-AH

## 2023-02-16 NOTE — ED Notes (Signed)
Pt in with co chest pressure that started yesterday. Pt recently dx with bronchitis and started on zpack. Pt states has had forceful coughing. Pain worse on inspiration and while coughing. Pt denies any cardiac hx.

## 2023-02-16 NOTE — ED Provider Notes (Signed)
[facility] Provider Note    Event Date/Time   First MD Initiated Contact with Patient 02/16/23 [phone]     (approximate)   History   Chief Complaint: Chest Pain   HPI  Willie Randolph is a 32 y.o. male  with h/o anxiety, htn, obesity who comes to the ED c/o cp starting yesterday. Intermittent, feels tight. Assoc. With cough, congestion, fatigue x 3 days. Was started on z pak by telehealth and sx have not improved yet. No sob. Not pleuritic. No exertional sx. No LE swelling/pain.        Physical Exam   Triage Vital Signs: ED Triage Vitals  Encounter Vitals Group     BP 02/16/23 0839 (!) 142/99     Systolic BP Percentile --      Diastolic BP Percentile --      Pulse Rate 02/16/23 0839 72     Resp 02/16/23 0839 18     Temp 02/16/23 0839 98 F (36.7 C)     Temp src --      SpO2 02/16/23 0839 96 %     Weight --      Height --      Head Circumference --      Peak Flow --      Pain Score 02/16/23 0838 8     Pain Loc --      Pain Education --      Exclude from Growth Chart --     Most recent vital signs: Vitals:   02/16/23 0839  BP: (!) 142/99  Pulse: 72  Resp: 18  Temp: 98 F (36.7 C)  SpO2: 96%    General: Awake, no distress.  CV:  Good peripheral perfusion. RRR Resp:  Normal effort. ctab Abd:  No distention. Soft nt Other:  Pain reproduced with parasternal palpation   ED Results / Procedures / Treatments   Labs (all labs ordered are listed, but only abnormal results are displayed) Labs Reviewed  BASIC METABOLIC PANEL - Abnormal; Notable for the following components:      Result Value   CO2 21 (*)    Glucose, Bld 116 (*)    All other components within normal limits  CBC  TROPONIN I (HIGH SENSITIVITY)     EKG Interpreted by me NSR, rate 66.  Normal axis, intervals, QRS, ST segments, and T waves.    RADIOLOGY Cxr interpreted by me, appears normal. Radiology report reviewed.   PROCEDURES:  Procedures   MEDICATIONS  ORDERED IN ED: Medications  ondansetron (ZOFRAN-ODT) disintegrating tablet 8 mg (8 mg Oral Given 02/16/23 1007)  naproxen (NAPROSYN) tablet 500 mg (500 mg Oral Given 02/16/23 1007)     IMPRESSION / MDM / ASSESSMENT AND PLAN / ED COURSE  I reviewed the triage vital signs and the nursing notes.  DDx: pna, ptx, nstemi, pleurisy, costochondritis, covid  Patient's presentation is most consistent with acute presentation with potential threat to life or bodily function.  Pt p/w viral sx x 5 days and atypical cp since yesterday.  Labs, EKG, cxr, and exam are all reassuring.   Considering the patient's symptoms, medical history, and physical examination today, I have low suspicion for ACS, PE, TAD, pneumothorax, carditis, mediastinitis, pneumonia, CHF, or sepsis.  Zofran, nsaids, pcp follow up.       FINAL CLINICAL IMPRESSION(S) / ED DIAGNOSES   Final diagnoses:  Pleurisy  Costochondritis     Rx / DC Orders   ED Discharge Orders  Ordered    ondansetron (ZOFRAN-ODT) 4 MG disintegrating tablet  Every 8 hours PRN        02/16/23 0955    naproxen (NAPROSYN) 500 MG tablet  2 times daily with meals        02/16/23 0955             Note:  This document was prepared using Dragon voice recognition software and may include unintentional dictation errors.   Sharman Cheek, MD 02/16/23 1014

## 2023-03-13 ENCOUNTER — Telehealth: Payer: BC Managed Care – PPO

## 2023-03-13 ENCOUNTER — Telehealth: Payer: BC Managed Care – PPO | Admitting: Physician Assistant

## 2023-03-13 DIAGNOSIS — J069 Acute upper respiratory infection, unspecified: Secondary | ICD-10-CM | POA: Diagnosis not present

## 2023-03-13 DIAGNOSIS — J029 Acute pharyngitis, unspecified: Secondary | ICD-10-CM | POA: Diagnosis not present

## 2023-03-13 MED ORDER — FLUTICASONE PROPIONATE 50 MCG/ACT NA SUSP: 2.0000 | Freq: Every day | NASAL | 1 refills | Status: DC

## 2023-03-13 MED ORDER — BENZONATATE 200 MG PO CAPS
200.0000 mg | ORAL_CAPSULE | Freq: Three times a day (TID) | ORAL | 0 refills | Status: DC | PRN
Start: 2023-03-13 — End: 2023-12-09

## 2023-03-13 MED ORDER — ALBUTEROL SULFATE HFA 108 (90 BASE) MCG/ACT IN AERS: 2.0000 | INHALATION_SPRAY | Freq: Four times a day (QID) | RESPIRATORY_TRACT | 1 refills | Status: DC | PRN

## 2023-03-13 NOTE — Progress Notes (Signed)
E-Visit for Sinus Problems  We are sorry that you are not feeling well.  Here is how we plan to help!  Based on what you have shared with me it looks like you have sinusitis.  Sinusitis is inflammation and infection in the sinus cavities of the head.  Based on your presentation I believe you most likely have Acute Viral Sinusitis.This is an infection most likely caused by a virus. There is not specific treatment for viral sinusitis other than to help you with the symptoms until the infection runs its course.  You may use an oral decongestant such as Mucinex D or if you have glaucoma or high blood pressure use plain Mucinex. Saline nasal spray help and can safely be used as often as needed for congestion, I have prescribed: Fluticasone nasal spray two sprays in each nostril once a day  I am also sending a prescription for cough medicine, Tessalon, for suppressing the cough. Also sending a prescription for an inhaler for cough and if you wheezing from coughing fits.  Please also take over the counter oral anti-histamine ie Zyrtec, Allegra, or Claritin for nasal drainage and congestion. Use over the counter throat logenzes for throat pain. If your symptoms don't improve in 2-3 days then contact us again.   Some authorities believe that zinc sprays or the use of Echinacea may shorten the course of your symptoms.  Sinus infections are not as easily transmitted as other respiratory infection, however we still recommend that you avoid close contact with loved ones, especially the very young and elderly.  Remember to wash your hands thoroughly throughout the day as this is the number one way to prevent the spread of infection!  Home Care: Only take medications as instructed by your medical team. Do not take these medications with alcohol. A steam or ultrasonic humidifier can help congestion.  You can place a towel over your head and breathe in the steam from hot water coming from a faucet. Avoid close  contacts especially the very young and the elderly. Cover your mouth when you cough or sneeze. Always remember to wash your hands.  Get Help Right Away If: You develop worsening fever or sinus pain. You develop a severe head ache or visual changes. Your symptoms persist after you have completed your treatment plan.  Make sure you Understand these instructions. Will watch your condition. Will get help right away if you are not doing well or get worse.   Thank you for choosing an e-visit.  Your e-visit answers were reviewed by a board certified advanced clinical practitioner to complete your personal care plan. Depending upon the condition, your plan could have included both over the counter or prescription medications.  Please review your pharmacy choice. Make sure the pharmacy is open so you can pick up prescription now. If there is a problem, you may contact your provider through [employer] and have the prescription routed to another pharmacy.  Your safety is important to [place]. If you have drug allergies check your prescription carefully.   For the next 24 hours you can use MyChart to ask questions about today's visit, request a non-urgent call back, or ask for a work or school excuse. You will get an email in the next two days asking about your experience. I hope that your e-visit has been valuable and will speed your recovery.  I have spent 5 minutes in review of e-visit questionnaire, review and updating patient chart, medical decision making and response to patient.  Gilberto Better, PA-C

## 2023-09-12 ENCOUNTER — Telehealth: Admitting: Family Medicine

## 2023-09-12 DIAGNOSIS — J301 Allergic rhinitis due to pollen: Secondary | ICD-10-CM

## 2023-09-12 DIAGNOSIS — J019 Acute sinusitis, unspecified: Secondary | ICD-10-CM

## 2023-09-12 DIAGNOSIS — B9689 Other specified bacterial agents as the cause of diseases classified elsewhere: Secondary | ICD-10-CM

## 2023-09-12 MED ORDER — PREDNISONE 20 MG PO TABS: 20.0000 mg | ORAL_TABLET | Freq: Two times a day (BID) | ORAL | 0 refills | Status: AC

## 2023-09-12 MED ORDER — AMOXICILLIN-POT CLAVULANATE 875-125 MG PO TABS: 1.0000 | ORAL_TABLET | Freq: Two times a day (BID) | ORAL | 0 refills | Status: DC

## 2023-09-12 NOTE — Progress Notes (Signed)
 Virtual Visit Consent   Willie Randolph, you are scheduled for a virtual visit with a [facility] provider today. Just as with appointments in the office, your consent must be obtained to participate. Your consent will be active for this visit and any virtual visit you may have with one of our providers in the next 365 days. If you have a MyChart account, a copy of this consent can be sent to you electronically.  As this is a virtual visit, video technology does not allow for your provider to perform a traditional examination. This may limit your provider's ability to fully assess your condition. If your provider identifies any concerns that need to be evaluated in person or the need to arrange testing (such as labs, EKG, etc.), we will make arrangements to do so. Although advances in technology are sophisticated, we cannot ensure that it will always work on either your end or our end. If the connection with a video visit is poor, the visit may have to be switched to a telephone visit. With either a video or telephone visit, we are not always able to ensure that we have a secure connection.  By engaging in this virtual visit, you consent to the provision of healthcare and authorize for your insurance to be billed (if applicable) for the services provided during this visit. Depending on your insurance coverage, you may receive a charge related to this service.  I need to obtain your verbal consent now. Are you willing to proceed with your visit today? Willie Randolph has provided verbal consent on 09/12/2023 for a virtual visit (video or telephone). Albertha Huger, FNP  Date: 09/12/2023 11:15 AM   Virtual Visit via Video Note   I, Albertha Huger, connected with  Willie Randolph  ([id number], [birth year]) on 09/12/23 at 11:15 AM EDT by a video-enabled telemedicine application and verified that I am speaking with the correct person using two identifiers.  Location: Patient: Virtual Visit Location Patient:  Home Provider: Virtual Visit Location Provider: Home Office   I discussed the limitations of evaluation and management by telemedicine and the availability of in person appointments. The patient expressed understanding and agreed to proceed.    History of Present Illness: Willie Randolph is a 33 y.o. who identifies as a male who was assigned male at birth, and is being seen today for sore throat, sinus pressure and pain, headache, thick green mucus, no fever. Sx for almost a week. On zyrtec and singulair. Also using a decongestant. Minimal cough. .  HPI: HPI  Problems:  Patient Active Problem List   Diagnosis Date Noted   ADHD 05/22/2022   Episodic migraine 09/12/2021   Bipolar disorder (HCC) 04/15/2020   GAD (generalized anxiety disorder) 04/15/2020   Essential hypertension 04/15/2020   Morbid obesity (HCC) 04/15/2020   Mixed hyperlipidemia 04/15/2020   Environmental and seasonal allergies 04/15/2020    Allergies: No Known Allergies Medications:  Current Outpatient Medications:    albuterol (VENTOLIN HFA) 108 (90 Base) MCG/ACT inhaler, Inhale 2 puffs into the lungs every 6 (six) hours as needed for wheezing or shortness of breath., Disp: 8 g, Rfl: 0   albuterol (VENTOLIN HFA) 108 (90 Base) MCG/ACT inhaler, Inhale 2 puffs into the lungs every 6 (six) hours as needed for wheezing or shortness of breath., Disp: 8 g, Rfl: 1   aspirin EC 81 MG tablet, Take 1 tablet (81 mg total) by mouth daily., Disp: , Rfl:    atenolol (TENORMIN) 25 MG tablet, Take 1 tablet (25  mg total) by mouth daily., Disp: 90 tablet, Rfl: 3   atorvastatin (LIPITOR) 20 MG tablet, Take 1 tablet (20 mg total) by mouth daily., Disp: 90 tablet, Rfl: 3   benzonatate (TESSALON) 100 MG capsule, Take 1 capsule (100 mg total) by mouth 3 (three) times daily as needed for cough., Disp: 30 capsule, Rfl: 0   benzonatate (TESSALON) 200 MG capsule, Take 1 capsule (200 mg total) by mouth 3 (three) times daily as needed for cough., Disp:  30 capsule, Rfl: 0   fluticasone (FLONASE) 50 MCG/ACT nasal spray, Place 2 sprays into both nostrils daily., Disp: 16 g, Rfl: 1   lisinopril (ZESTRIL) 10 MG tablet, Take 1 tablet (10 mg total) by mouth daily., Disp: 90 tablet, Rfl: 3   montelukast (SINGULAIR) 10 MG tablet, Take 1 tablet (10 mg total) by mouth at bedtime., Disp: 90 tablet, Rfl: 3   naproxen (NAPROSYN) 500 MG tablet, Take 1 tablet (500 mg total) by mouth 2 (two) times daily with a meal., Disp: 20 tablet, Rfl: 0   ondansetron (ZOFRAN-ODT) 4 MG disintegrating tablet, Take 1 tablet (4 mg total) by mouth every 8 (eight) hours as needed for nausea or vomiting., Disp: 20 tablet, Rfl: 0   VRAYLAR 1.5 MG capsule, Take 1.5 mg by mouth daily., Disp: , Rfl:   Observations/Objective: Patient is well-developed, well-nourished in no acute distress.  Resting comfortably  at home.  Head is normocephalic, atraumatic.  No labored breathing.  Speech is clear and coherent with logical content.  Patient is alert and oriented at baseline.    Assessment and Plan: 1. Acute bacterial sinusitis (Primary)  2. Allergic rhinitis due to pollen, unspecified seasonality  Increase fluids, humidifer at night, tylenol, urgent care if sx worsen.   Follow Up Instructions: I discussed the assessment and treatment plan with the patient. The patient was provided an opportunity to ask questions and all were answered. The patient agreed with the plan and demonstrated an understanding of the instructions.  A copy of instructions were sent to the patient via MyChart unless otherwise noted below.    The patient was advised to call back or seek an in-person evaluation if the symptoms worsen or if the condition fails to improve as anticipated.    Rosary Filosa, FNP

## 2023-09-12 NOTE — Patient Instructions (Signed)

## 2023-11-30 ENCOUNTER — Other Ambulatory Visit: Payer: Self-pay | Admitting: Family Medicine

## 2023-11-30 DIAGNOSIS — I10 Essential (primary) hypertension: Secondary | ICD-10-CM

## 2023-11-30 DIAGNOSIS — R7309 Other abnormal glucose: Secondary | ICD-10-CM

## 2023-11-30 DIAGNOSIS — F3177 Bipolar disorder, in partial remission, most recent episode mixed: Secondary | ICD-10-CM

## 2023-11-30 DIAGNOSIS — E785 Hyperlipidemia, unspecified: Secondary | ICD-10-CM

## 2023-11-30 DIAGNOSIS — Z0000 Encounter for general adult medical examination without abnormal findings: Secondary | ICD-10-CM

## 2023-12-01 ENCOUNTER — Other Ambulatory Visit: Payer: Self-pay

## 2023-12-02 ENCOUNTER — Ambulatory Visit: Payer: Self-pay | Admitting: Family Medicine

## 2023-12-02 LAB — HEMOGLOBIN A1C
Hgb A1c MFr Bld: 5.4 % (ref <5.7)
Mean Plasma Glucose: 108 mg/dL
eAG (mmol/L): 6 mmol/L

## 2023-12-02 LAB — CBC WITH DIFFERENTIAL/PLATELET
Absolute Lymphocytes: 1504 {cells}/uL (ref 850–3900)
Absolute Monocytes: 281 {cells}/uL (ref 200–950)
Basophils Absolute: 21 {cells}/uL (ref 0–200)
Basophils Relative: 0.5 %
Eosinophils Absolute: 21 {cells}/uL (ref 15–500)
Eosinophils Relative: 0.5 %
HCT: 42.8 % (ref 38.5–50.0)
Hemoglobin: 14.4 g/dL (ref 13.2–17.1)
MCH: 30.8 pg (ref 27.0–33.0)
MCHC: 33.6 g/dL (ref 32.0–36.0)
MCV: 91.5 fL (ref 80.0–100.0)
MPV: 11 fL (ref 7.5–12.5)
Monocytes Relative: 6.7 %
Neutro Abs: 2373 {cells}/uL (ref 1500–7800)
Neutrophils Relative %: 56.5 %
Platelets: 235 10*3/uL (ref 140–400)
RBC: 4.68 Million/uL (ref 4.20–5.80)
RDW: 12.6 % (ref 11.0–15.0)
Total Lymphocyte: 35.8 %
WBC: 4.2 10*3/uL (ref 3.8–10.8)

## 2023-12-02 LAB — LIPID PANEL
Cholesterol: 150 mg/dL (ref <200)
HDL: 35 mg/dL — ABNORMAL LOW (ref >=40)
LDL Cholesterol (Calc): 78 mg/dL
Non-HDL Cholesterol (Calc): 115 mg/dL (ref <130)
Total CHOL/HDL Ratio: 4.3 (calc) (ref <5.0)
Triglycerides: 306 mg/dL — ABNORMAL HIGH (ref <150)

## 2023-12-02 LAB — COMPREHENSIVE METABOLIC PANEL WITH GFR
AG Ratio: 2.4 (calc) (ref 1.0–2.5)
ALT: 19 U/L (ref 9–46)
AST: 12 U/L (ref 10–40)
Albumin: 4.7 g/dL (ref 3.6–5.1)
Alkaline phosphatase (APISO): 88 U/L (ref 36–130)
BUN: 15 mg/dL (ref 7–25)
CO2: 24 mmol/L (ref 20–32)
Calcium: 9.4 mg/dL (ref 8.6–10.3)
Chloride: 107 mmol/L (ref 98–110)
Creat: 1.12 mg/dL (ref 0.60–1.26)
Globulin: 2 g/dL (ref 1.9–3.7)
Glucose, Bld: 101 mg/dL — ABNORMAL HIGH (ref 65–99)
Potassium: 4.1 mmol/L (ref 3.5–5.3)
Sodium: 138 mmol/L (ref 135–146)
Total Bilirubin: 0.4 mg/dL (ref 0.2–1.2)
Total Protein: 6.7 g/dL (ref 6.1–8.1)
eGFR: 89 mL/min/{1.73_m2} (ref >=60)

## 2023-12-02 LAB — TSH: TSH: 1.52 m[IU]/L (ref 0.40–4.50)

## 2023-12-09 ENCOUNTER — Encounter: Payer: Self-pay | Admitting: Family Medicine

## 2023-12-09 ENCOUNTER — Ambulatory Visit (INDEPENDENT_AMBULATORY_CARE_PROVIDER_SITE_OTHER): Payer: Self-pay | Admitting: Family Medicine

## 2023-12-09 ENCOUNTER — Other Ambulatory Visit: Payer: Self-pay | Admitting: Family Medicine

## 2023-12-09 VITALS — BP 120/68 | HR 70 | Ht 66.0 in | Wt 246.8 lb

## 2023-12-09 DIAGNOSIS — E785 Hyperlipidemia, unspecified: Secondary | ICD-10-CM

## 2023-12-09 DIAGNOSIS — F3177 Bipolar disorder, in partial remission, most recent episode mixed: Secondary | ICD-10-CM

## 2023-12-09 DIAGNOSIS — Z0000 Encounter for general adult medical examination without abnormal findings: Secondary | ICD-10-CM

## 2023-12-09 DIAGNOSIS — I10 Essential (primary) hypertension: Secondary | ICD-10-CM | POA: Diagnosis not present

## 2023-12-09 DIAGNOSIS — J3089 Other allergic rhinitis: Secondary | ICD-10-CM | POA: Diagnosis not present

## 2023-12-09 DIAGNOSIS — Z1159 Encounter for screening for other viral diseases: Secondary | ICD-10-CM

## 2023-12-09 DIAGNOSIS — R7309 Other abnormal glucose: Secondary | ICD-10-CM

## 2023-12-09 MED ORDER — MONTELUKAST SODIUM 10 MG PO TABS: 10.0000 mg | ORAL_TABLET | Freq: Every day | ORAL | 3 refills | Status: AC

## 2023-12-09 MED ORDER — ATORVASTATIN CALCIUM 20 MG PO TABS: 20.0000 mg | ORAL_TABLET | Freq: Every day | ORAL | 3 refills | Status: AC

## 2023-12-09 MED ORDER — ATENOLOL 25 MG PO TABS: 25.0000 mg | ORAL_TABLET | Freq: Every day | ORAL | 3 refills | Status: AC

## 2023-12-09 MED ORDER — LISINOPRIL 10 MG PO TABS: 10.0000 mg | ORAL_TABLET | Freq: Every day | ORAL | 3 refills | Status: AC

## 2023-12-09 NOTE — Patient Instructions (Addendum)
 Thank you for coming to the office today.  Refilled meds  Keep up the great work  Continue the sugar free options.  DUE for FASTING BLOOD WORK (no food or drink after midnight before the lab appointment, only water or coffee without cream/sugar on the morning of)  - Make sure Lab Only appointment is at about 1 week before your next appointment, so that results will be available  For Lab Results, once available within 2-3 days of blood draw, you can can log in to MyChart online to view your results and a brief explanation. Also, we can discuss results at next follow-up visit.   Please schedule a Follow-up Appointment to: Return for 1 year fasting lab > 1 week later Annual Physical.  If you have any other questions or concerns, please feel free to call the office or send a message through MyChart. You may also schedule an earlier appointment if necessary.  Additionally, you may be receiving a survey about your experience at our office within a few days to 1 week by e-mail or mail. We value your feedback.  Marsa Officer, DO [facility], [place]

## 2023-12-09 NOTE — Progress Notes (Signed)
 Subjective:    Patient ID: Willie Randolph, male    DOB: January 14, 1991, 33 y.o.   MRN: [id number]  Willie Randolph is a 33 y.o. male presenting on 12/09/2023 for Annual Exam   HPI  Discussed the use of AI scribe software for clinical note transcription with the patient, who gave verbal consent to proceed.  History of Present Illness   [name] is a 33 year old male who presents for a routine follow-up visit.  Bipolar Disorder / Depression / Anxiety Mood and mental health - In October, had a mental health-related ER visit and was admitted to [facility] for several days - Currently taking Wellbutrin  100 mg twice daily, Buspar 10 mg twice daily, and trazodone 50 mg at night - Also takes dextromethorphan, three pills twice daily, in combination with Wellbutrin  - Mood has improved over the past few months, feeling better than in years - Vraylar has been discontinued  Dietary and lifestyle modifications A1c 5.4, improved, normal range. - Monitoring diet with particular focus on reducing sugar intake - Switched to zero sugar sodas - Perceives improvement in overall well-being over the past few months  CHRONIC HTN: Morbid Obesity BMI >39 Reports chronic history of HTN, has been controlled on HTN. Current Meds: -  Atenolol  25mg  daily, Lisinopril  10mg  daily Reports good compliance, took meds today. Tolerating well, w/o complaints. Denies CP, dyspnea, HA, edema, dizziness / lightheadedness   HYPERLIPIDEMIA: Hypertriglyceridemia Last result with TG 306, HDL 35 and LDL 78 - Currently taking Atorvastatin  20mg  daily, tolerating well without side effects or myalgias - Not taking Omega 3 fish oil but may try.   GERD Taking Famotidine 20mg  BID     Migraine Headaches, episodic Nausea Prior chronic history, usually migraines only 1-2 times a year    Health Maintenance:      12/09/2023   10:53 AM 12/08/2022    3:53 PM 05/22/2022   11:36 AM  Depression screen PHQ 2/9  Decreased Interest  0 1 2  Down, Depressed, Hopeless 0 1 3  PHQ - 2 Score 0 2 5  Altered sleeping 0 2 1  Tired, decreased energy 0 2 2  Change in appetite 0 0 2  Feeling bad or failure about yourself  0 1 3  Trouble concentrating 0 1 3  Moving slowly or fidgety/restless 0 1 1  Suicidal thoughts 0 0 2  PHQ-9 Score 0 9 19  Difficult doing work/chores Not difficult at all Somewhat difficult Very difficult       12/09/2023   10:53 AM 12/08/2022    3:53 PM 05/22/2022   11:37 AM 09/12/2021   10:37 AM  GAD 7 : Generalized Anxiety Score  Nervous, Anxious, on Edge 0 2 2 1   Control/stop worrying 0 3 2 2   Worry too much - different things 0 2 3 2   Trouble relaxing 0 2 2 1   Restless 0 3 3 1   Easily annoyed or irritable 0 2 3 2   Afraid - awful might happen 0 1 1 1   Total GAD 7 Score 0 15 16 10   Anxiety Difficulty Not difficult at all Somewhat difficult Somewhat difficult Somewhat difficult     Past Medical History:  Diagnosis Date   Allergy    Depression    GERD (gastroesophageal reflux disease)    Hypertension    Kidney stones    Migraine    Past Surgical History:  Procedure Laterality Date   KIDNEY STONE SURGERY     LITHOTRIPSY  Social History   Socioeconomic History   Marital status: Married    Spouse name: [place]   Number of children: 1   Years of education: Not on file   Highest education level: Master's degree (e.g., MA, MS, MEng, MEd, MSW, MBA)  Occupational History   Not on file  Tobacco Use   Smoking status: Never   Smokeless tobacco: Never  Vaping Use   Vaping status: Never Used  Substance and Sexual Activity   Alcohol use: Yes    Comment: occ   Drug use: Not Currently   Sexual activity: Not on file  Other Topics Concern   Not on file  Social History Narrative   Lives with wife   Caffeine- sodas 2-3 a day   Social Drivers of Health   Financial Resource Strain: Low Risk  (12/06/2023)   Overall Financial Resource Strain (CARDIA)    Difficulty of Paying Living  Expenses: Not hard at all  Food Insecurity: No Food Insecurity (12/06/2023)   Hunger Vital Sign    Worried About Running Out of Food in the Last Year: Never true    Ran Out of Food in the Last Year: Never true  Transportation Needs: No Transportation Needs (12/06/2023)   PRAPARE - Administrator, Civil Service (Medical): No    Lack of Transportation (Non-Medical): No  Physical Activity: Insufficiently Active (12/06/2023)   Exercise Vital Sign    Days of Exercise per Week: 3 days    Minutes of Exercise per Session: 30 min  Stress: No Stress Concern Present (12/06/2023)   [employer] of Occupational Health - Occupational Stress Questionnaire    Feeling of Stress: Only a little  Social Connections: Moderately Isolated (12/06/2023)   Social Connection and Isolation Panel    Frequency of Communication with Friends and Family: Three times a week    Frequency of Social Gatherings with Friends and Family: Once a week    Attends Religious Services: Never    Database administrator or Organizations: No    Attends Engineer, structural: Not on file    Marital Status: Married  Intimate Partner Violence: Unknown (09/03/2021)   Received from [facility]   HITS    Physically Hurt: Not on file    Insult or Talk Down To: Not on file    Threaten Physical Harm: Not on file    Scream or Curse: Not on file   Family History  Problem Relation Age of Onset   Depression Mother    Heart disease Father    Diabetes Father    Depression Father    Depression Brother    Heart attack Maternal Grandfather 46   COPD Paternal Grandmother 15   Heart attack Paternal Grandfather 75   Current Outpatient Medications on File Prior to Visit  Medication Sig   aspirin EC 81 MG tablet Take 1 tablet (81 mg total) by mouth daily.   Dextromethorphan HBr 15 MG CAPS Take 45 mg by mouth 2 (two) times daily.   albuterol  (VENTOLIN  HFA) 108 (90 Base) MCG/ACT inhaler Inhale 2 puffs into the lungs every 6 (six)  hours as needed for wheezing or shortness of breath. (Patient not taking: Reported on 12/09/2023)   albuterol  (VENTOLIN  HFA) 108 (90 Base) MCG/ACT inhaler Inhale 2 puffs into the lungs every 6 (six) hours as needed for wheezing or shortness of breath. (Patient not taking: Reported on 12/09/2023)   buPROPion  ER (WELLBUTRIN  SR) 100 MG 12 hr tablet Take 100 mg  by mouth 2 (two) times daily.   busPIRone (BUSPAR) 10 MG tablet Take 10 mg by mouth 2 (two) times daily.   traZODone (DESYREL) 50 MG tablet Take 50 mg by mouth at bedtime.   No current facility-administered medications on file prior to visit.    Review of Systems  Constitutional:  Negative for activity change, appetite change, chills, diaphoresis, fatigue and fever.  HENT:  Negative for congestion and hearing loss.   Eyes:  Negative for visual disturbance.  Respiratory:  Negative for cough, chest tightness, shortness of breath and wheezing.   Cardiovascular:  Negative for chest pain, palpitations and leg swelling.  Gastrointestinal:  Negative for abdominal pain, constipation, diarrhea, nausea and vomiting.  Genitourinary:  Negative for dysuria, frequency and hematuria.  Musculoskeletal:  Negative for arthralgias and neck pain.  Skin:  Negative for rash.  Neurological:  Negative for dizziness, weakness, light-headedness, numbness and headaches.  Hematological:  Negative for adenopathy.  Psychiatric/Behavioral:  Negative for behavioral problems, dysphoric mood and sleep disturbance.    Per HPI unless specifically indicated above     Objective:    BP 120/68 (BP Location: Left Arm, Patient Position: Sitting)   Pulse 70   Ht 5' 6 (1.676 m)   Wt 246 lb 12.8 oz (111.9 kg)   SpO2 99%   BMI 39.83 kg/m   Wt Readings from Last 3 Encounters:  12/09/23 246 lb 12.8 oz (111.9 kg)  12/08/22 245 lb (111.1 kg)  09/25/22 241 lb (109.3 kg)    Physical Exam Vitals and nursing note reviewed.  Constitutional:      General: He is not in acute  distress.    Appearance: He is well-developed. He is not diaphoretic.     Comments: Well-appearing, comfortable, cooperative  HENT:     Head: Normocephalic and atraumatic.  Eyes:     General:        Right eye: No discharge.        Left eye: No discharge.     Conjunctiva/sclera: Conjunctivae normal.     Pupils: Pupils are equal, round, and reactive to light.  Neck:     Thyroid: No thyromegaly.  Cardiovascular:     Rate and Rhythm: Normal rate and regular rhythm.     Pulses: Normal pulses.     Heart sounds: Normal heart sounds. No murmur heard. Pulmonary:     Effort: Pulmonary effort is normal. No respiratory distress.     Breath sounds: Normal breath sounds. No wheezing or rales.  Abdominal:     General: Bowel sounds are normal. There is no distension.     Palpations: Abdomen is soft. There is no mass.     Tenderness: There is no abdominal tenderness.  Musculoskeletal:        General: No tenderness. Normal range of motion.     Cervical back: Normal range of motion and neck supple.     Comments: Upper / Lower Extremities: - Normal muscle tone, strength bilateral upper extremities 5/5, lower extremities 5/5  Lymphadenopathy:     Cervical: No cervical adenopathy.  Skin:    General: Skin is warm and dry.     Findings: No erythema or rash.  Neurological:     Mental Status: He is alert and oriented to person, place, and time.     Comments: Distal sensation intact to light touch all extremities  Psychiatric:        Mood and Affect: Mood normal.        Behavior: Behavior normal.  Thought Content: Thought content normal.     Comments: Well groomed, good eye contact, normal speech and thoughts     Results for orders placed or performed in visit on 11/30/23  TSH   Collection Time: 12/01/23  8:24 AM  Result Value Ref Range   TSH 1.52 0.40 - 4.50 mIU/L  Lipid panel   Collection Time: 12/01/23  8:24 AM  Result Value Ref Range   Cholesterol 150 <200 mg/dL   HDL 35 (L) > OR  = 40 mg/dL   Triglycerides 693 (H) <150 mg/dL   LDL Cholesterol (Calc) 78 mg/dL (calc)   Total CHOL/HDL Ratio 4.3 <5.0 (calc)   Non-HDL Cholesterol (Calc) 115 <130 mg/dL (calc)  Hemoglobin J8r   Collection Time: 12/01/23  8:24 AM  Result Value Ref Range   Hgb A1c MFr Bld 5.4 <5.7 %   Mean Plasma Glucose 108 mg/dL   eAG (mmol/L) 6.0 mmol/L  CBC with Differential/Platelet   Collection Time: 12/01/23  8:24 AM  Result Value Ref Range   WBC 4.2 3.8 - 10.8 Thousand/uL   RBC 4.68 4.20 - 5.80 Million/uL   Hemoglobin 14.4 13.2 - 17.1 g/dL   HCT 57.1 61.4 - 49.9 %   MCV 91.5 80.0 - 100.0 fL   MCH 30.8 27.0 - 33.0 pg   MCHC 33.6 32.0 - 36.0 g/dL   RDW 87.3 88.9 - 84.9 %   Platelets 235 140 - 400 Thousand/uL   MPV 11.0 7.5 - 12.5 fL   Neutro Abs 2,373 1,500 - 7,800 cells/uL   Absolute Lymphocytes 1,504 850 - 3,900 cells/uL   Absolute Monocytes 281 200 - 950 cells/uL   Eosinophils Absolute 21 15 - 500 cells/uL   Basophils Absolute 21 0 - 200 cells/uL   Neutrophils Relative % 56.5 %   Total Lymphocyte 35.8 %   Monocytes Relative 6.7 %   Eosinophils Relative 0.5 %   Basophils Relative 0.5 %  Comprehensive metabolic panel with GFR   Collection Time: 12/01/23  8:24 AM  Result Value Ref Range   Glucose, Bld 101 (H) 65 - 99 mg/dL   BUN 15 7 - 25 mg/dL   Creat 8.87 9.39 - 8.73 mg/dL   eGFR 89 > OR = 60 fO/fpw/8.26f7   BUN/Creatinine Ratio SEE NOTE: 6 - 22 (calc)   Sodium 138 135 - 146 mmol/L   Potassium 4.1 3.5 - 5.3 mmol/L   Chloride 107 98 - 110 mmol/L   CO2 24 20 - 32 mmol/L   Calcium  9.4 8.6 - 10.3 mg/dL   Total Protein 6.7 6.1 - 8.1 g/dL   Albumin 4.7 3.6 - 5.1 g/dL   Globulin 2.0 1.9 - 3.7 g/dL (calc)   AG Ratio 2.4 1.0 - 2.5 (calc)   Total Bilirubin 0.4 0.2 - 1.2 mg/dL   Alkaline phosphatase (APISO) 88 36 - 130 U/L   AST 12 10 - 40 U/L   ALT 19 9 - 46 U/L      Assessment & Plan:   Problem List Items Addressed This Visit     Environmental and seasonal allergies    Relevant Medications   montelukast  (SINGULAIR ) 10 MG tablet   Essential hypertension   Relevant Medications   atorvastatin  (LIPITOR) 20 MG tablet   atenolol  (TENORMIN ) 25 MG tablet   lisinopril  (ZESTRIL ) 10 MG tablet   Other Visit Diagnoses       Hyperlipidemia, unspecified hyperlipidemia type    -  Primary   Relevant Medications   atorvastatin  (LIPITOR)  20 MG tablet   atenolol  (TENORMIN ) 25 MG tablet   lisinopril  (ZESTRIL ) 10 MG tablet        Updated Health Maintenance information Reviewed recent lab results with patient Encouraged improvement to lifestyle with diet and exercise Goal of weight loss  Mental Health Management Bipolar / Depression / Anxiety Records need synchronization with [facility]. Following Behavioral Health Hospitalization 03/2023. He has transferred mental health care from [facility] to [facility]  New medications since last visit - Continue Wellbutrin  100 mg twice daily. - Continue Buspar 10 mg twice daily. - Continue dextromethorphan as prescribed.  Continuation of medication - Continue Trazodone 50 mg at night. - Follow up with [facility] for mental health management.  Hypertension Blood pressure well-controlled with lisinopril  and atenolol . Potential to reduce medication with lifestyle improvements. - Continue lisinopril  as prescribed. - Consider reducing medication in the future if lifestyle improvements continue.  Hyperlipidemia LDL controlled with atorvastatin . Elevated triglycerides likely genetic. Dietary changes beneficial. - Continue atorvastatin  as prescribed. - Encourage continued dietary modifications to manage triglycerides.  General Health Maintenance Mild glucose elevation, A1c normal. Lifestyle changes beneficial. - Consider optional HIV and Hepatitis C screening in the future if desired. - Encourage continued lifestyle modifications, including reduced sugar intake and increased physical activity.  Follow-up Scheduled for  follow-up next year. Available for communication if needed. - Schedule follow-up appointment for next year. - Contact provider as needed via video message or other communication methods.        No orders of the defined types were placed in this encounter.   Meds ordered this encounter  Medications   atorvastatin  (LIPITOR) 20 MG tablet    Sig: Take 1 tablet (20 mg total) by mouth daily.    Dispense:  90 tablet    Refill:  3   atenolol  (TENORMIN ) 25 MG tablet    Sig: Take 1 tablet (25 mg total) by mouth daily.    Dispense:  90 tablet    Refill:  3   lisinopril  (ZESTRIL ) 10 MG tablet    Sig: Take 1 tablet (10 mg total) by mouth daily.    Dispense:  90 tablet    Refill:  3   montelukast  (SINGULAIR ) 10 MG tablet    Sig: Take 1 tablet (10 mg total) by mouth at bedtime.    Dispense:  90 tablet    Refill:  3     Follow up plan: Return for 1 year fasting lab > 1 week later Annual Physical.  12/08/2024   Marsa Officer, DO [facility] 12/09/2023, 11:07 AM

## 2024-06-16 ENCOUNTER — Telehealth: Admitting: Physician Assistant

## 2024-06-16 DIAGNOSIS — J069 Acute upper respiratory infection, unspecified: Secondary | ICD-10-CM

## 2024-06-16 DIAGNOSIS — B9689 Other specified bacterial agents as the cause of diseases classified elsewhere: Secondary | ICD-10-CM

## 2024-06-16 MED ORDER — FLUTICASONE PROPIONATE 50 MCG/ACT NA SUSP: 2.0000 | Freq: Every day | NASAL | 0 refills | Status: AC

## 2024-06-16 MED ORDER — PREDNISONE 10 MG (21) PO TBPK: ORAL_TABLET | ORAL | 0 refills | Status: AC

## 2024-06-16 MED ORDER — AMOXICILLIN-POT CLAVULANATE 875-125 MG PO TABS: 1.0000 | ORAL_TABLET | Freq: Two times a day (BID) | ORAL | 0 refills | Status: AC

## 2024-06-16 NOTE — Patient Instructions (Signed)
 " Lang Servant, thank you for joining Willie Velma Lunger, PA-C for today's virtual visit.  While this provider is not your primary care provider (PCP), if your PCP is located in our provider database this encounter information will be shared with them immediately following your visit.   A [facility] MyChart account gives you access to today's visit and all your visits, tests, and labs performed at [facility]  click here if you don't have a [facility] MyChart account or go to [email or web address]  Consent: (Patient) Wayne Wicklund provided verbal consent for this virtual visit at the beginning of the encounter.  Current Medications:  Current Outpatient Medications:    albuterol  (VENTOLIN  HFA) 108 (90 Base) MCG/ACT inhaler, Inhale 2 puffs into the lungs every 6 (six) hours as needed for wheezing or shortness of breath. (Patient not taking: Reported on 12/09/2023), Disp: 8 g, Rfl: 0   albuterol  (VENTOLIN  HFA) 108 (90 Base) MCG/ACT inhaler, Inhale 2 puffs into the lungs every 6 (six) hours as needed for wheezing or shortness of breath. (Patient not taking: Reported on 12/09/2023), Disp: 8 g, Rfl: 1   aspirin EC 81 MG tablet, Take 1 tablet (81 mg total) by mouth daily., Disp: , Rfl:    atenolol  (TENORMIN ) 25 MG tablet, Take 1 tablet (25 mg total) by mouth daily., Disp: 90 tablet, Rfl: 3   atorvastatin  (LIPITOR) 20 MG tablet, Take 1 tablet (20 mg total) by mouth daily., Disp: 90 tablet, Rfl: 3   buPROPion  ER (WELLBUTRIN  SR) 100 MG 12 hr tablet, Take 100 mg by mouth 2 (two) times daily., Disp: , Rfl:    busPIRone (BUSPAR) 10 MG tablet, Take 10 mg by mouth 2 (two) times daily., Disp: , Rfl:    Dextromethorphan HBr 15 MG CAPS, Take 45 mg by mouth 2 (two) times daily., Disp: , Rfl:    lisinopril  (ZESTRIL ) 10 MG tablet, Take 1 tablet (10 mg total) by mouth daily., Disp: 90 tablet, Rfl: 3   montelukast  (SINGULAIR ) 10 MG tablet, Take 1 tablet (10 mg total) by mouth at bedtime., Disp: 90  tablet, Rfl: 3   traZODone (DESYREL) 50 MG tablet, Take 50 mg by mouth at bedtime., Disp: , Rfl:    Medications ordered in this encounter:  No orders of the defined types were placed in this encounter.    *If you need refills on other medications prior to your next appointment, please contact your pharmacy*  Follow-Up: Call back or seek an in-person evaluation if the symptoms worsen or if the condition fails to improve as anticipated.  [facility] [phone]  Other Instructions Please take antibiotic as directed.  Increase fluid intake.  Use Saline nasal spray.  Take a daily multivitamin. Take the steroid pack and use nasal steroid spray as directed. Tylenol  OTC ok to take.  Place a humidifier in the bedroom.  Please call or return clinic if symptoms are not improving.  Sinusitis Sinusitis is redness, soreness, and swelling (inflammation) of the paranasal sinuses. Paranasal sinuses are air pockets within the bones of your face (beneath the eyes, the middle of the forehead, or above the eyes). In healthy paranasal sinuses, mucus is able to drain out, and air is able to circulate through them by way of your nose. However, when your paranasal sinuses are inflamed, mucus and air can become trapped. This can allow bacteria and other germs to grow and cause infection. Sinusitis can develop quickly and last only a short time (acute) or continue over  a long period (chronic). Sinusitis that lasts for more than 12 weeks is considered chronic.  CAUSES  Causes of sinusitis include: Allergies. Structural abnormalities, such as displacement of the cartilage that separates your nostrils (deviated septum), which can decrease the air flow through your nose and sinuses and affect sinus drainage. Functional abnormalities, such as when the small hairs (cilia) that line your sinuses and help remove mucus do not work properly or are not present. SYMPTOMS  Symptoms of acute and chronic sinusitis  are the same. The primary symptoms are pain and pressure around the affected sinuses. Other symptoms include: Upper toothache. Earache. Headache. Bad breath. Decreased sense of smell and taste. A cough, which worsens when you are lying flat. Fatigue. Fever. Thick drainage from your nose, which often is green and may contain pus (purulent). Swelling and warmth over the affected sinuses. DIAGNOSIS  Your caregiver will perform a physical exam. During the exam, your caregiver may: Look in your nose for signs of abnormal growths in your nostrils (nasal polyps). Tap over the affected sinus to check for signs of infection. View the inside of your sinuses (endoscopy) with a special imaging device with a light attached (endoscope), which is inserted into your sinuses. If your caregiver suspects that you have chronic sinusitis, one or more of the following tests may be recommended: Allergy tests. Nasal culture A sample of mucus is taken from your nose and sent to a lab and screened for bacteria. Nasal cytology A sample of mucus is taken from your nose and examined by your caregiver to determine if your sinusitis is related to an allergy. TREATMENT  Most cases of acute sinusitis are related to a viral infection and will resolve on their own within 10 days. Sometimes medicines are prescribed to help relieve symptoms (pain medicine, decongestants, nasal steroid sprays, or saline sprays).  However, for sinusitis related to a bacterial infection, your caregiver will prescribe antibiotic medicines. These are medicines that will help kill the bacteria causing the infection.  Rarely, sinusitis is caused by a fungal infection. In theses cases, your caregiver will prescribe antifungal medicine. For some cases of chronic sinusitis, surgery is needed. Generally, these are cases in which sinusitis recurs more than 3 times per year, despite other treatments. HOME CARE INSTRUCTIONS  Drink plenty of water. Water  helps thin the mucus so your sinuses can drain more easily. Use a humidifier. Inhale steam 3 to 4 times a day (for example, sit in the bathroom with the shower running). Apply a warm, moist washcloth to your face 3 to 4 times a day, or as directed by your caregiver. Use saline nasal sprays to help moisten and clean your sinuses. Take over-the-counter or prescription medicines for pain, discomfort, or fever only as directed by your caregiver. SEEK IMMEDIATE MEDICAL CARE IF: You have increasing pain or severe headaches. You have nausea, vomiting, or drowsiness. You have swelling around your face. You have vision problems. You have a stiff neck. You have difficulty breathing. MAKE SURE YOU:  Understand these instructions. Will watch your condition. Will get help right away if you are not doing well or get worse. Document Released: 05/18/2005 Document Revised: 08/10/2011 Document Reviewed: 06/02/2011 [facility] Patient Information 2014 [place], [place].    If you have been instructed to have an in-person evaluation today at a local Urgent Care facility, please use the link below. It will take you to a list of all of our available [place] [facility], including address, phone number and hours  of operation. Please do not delay care.  [facility]  If you or a family member do not have a primary care provider, use the link below to schedule a visit and establish care. When you choose a [facility] primary care physician or advanced practice provider, you gain a long-term partner in health. Find a Primary Care Provider  Learn more about [facility]'s in-office and virtual care options: [place] - Get Care Now  "

## 2024-06-16 NOTE — Progress Notes (Signed)
 " Virtual Visit Consent   Rawley Randolph, you are scheduled for a virtual visit with a [facility] provider today. Just as with appointments in the office, your consent must be obtained to participate. Your consent will be active for this visit and any virtual visit you may have with one of our providers in the next 365 days. If you have a MyChart account, a copy of this consent can be sent to you electronically.  As this is a virtual visit, video technology does not allow for your provider to perform a traditional examination. This may limit your provider's ability to fully assess your condition. If your provider identifies any concerns that need to be evaluated in person or the need to arrange testing (such as labs, EKG, etc.), we will make arrangements to do so. Although advances in technology are sophisticated, we cannot ensure that it will always work on either your end or our end. If the connection with a video visit is poor, the visit may have to be switched to a telephone visit. With either a video or telephone visit, we are not always able to ensure that we have a secure connection.  By engaging in this virtual visit, you consent to the provision of healthcare and authorize for your insurance to be billed (if applicable) for the services provided during this visit. Depending on your insurance coverage, you may receive a charge related to this service.  I need to obtain your verbal consent now. Are you willing to proceed with your visit today? Denis Slager has provided verbal consent on 06/16/2024 for a virtual visit (video or telephone). Willie Randolph, [place]  Date: 06/16/2024 2:44 PM   Virtual Visit via Video Note   I, Willie Randolph, connected with  Hyde Sires  ([id number], February 02, 1991) on 06/16/24 at  2:45 PM EST by a video-enabled telemedicine application and verified that I am speaking with the correct person using two identifiers.  Location: Patient: Virtual Visit Location  Patient: Home Provider: Virtual Visit Location Provider: Home Office   I discussed the limitations of evaluation and management by telemedicine and the availability of in person appointments. The patient expressed understanding and agreed to proceed.    History of Present Illness: Willie Randolph is a 34 y.o. who identifies as a male who was assigned male at birth, and is being seen today for 5 days of URI symptoms starting with nasal congestion, sinus drainage, mild cough.  Tuesday night into Wednesday morning with substantial sore throat.  Notes nasal discharge, thick and green. Now with some sinus pain but the throat is the worst. Denies fevers, chills.  OTC -- Ibuprofen which helps temporarily, cough drops.   HPI: HPI  Problems:  Patient Active Problem List   Diagnosis Date Noted   ADHD 05/22/2022   Episodic migraine 09/12/2021   Bipolar disorder (HCC) 04/15/2020   GAD (generalized anxiety disorder) 04/15/2020   Essential hypertension 04/15/2020   Morbid obesity (HCC) 04/15/2020   Mixed hyperlipidemia 04/15/2020   Environmental and seasonal allergies 04/15/2020    Allergies: Allergies[1] Medications: Current Medications[2]  Observations/Objective: Patient is well-developed, well-nourished in no acute distress.  Resting comfortably  at home.  Head is normocephalic, atraumatic.  No labored breathing.  Speech is clear and coherent with logical content.  Patient is alert and oriented at baseline.  Mild oropharyngeal erythema without tonsillar/uvular edema or exudate. Uvula midline.  Assessment and Plan: 1. Bacterial URI (Primary) - amoxicillin -clavulanate (AUGMENTIN ) 875-125 MG tablet; Take 1 tablet by mouth  2 (two) times daily.  Dispense: 14 tablet; Refill: 0 - predniSONE  (STERAPRED UNI-PAK 21 TAB) 10 MG (21) TBPK tablet; Take following package directions  Dispense: 21 tablet; Refill: 0 - fluticasone  (FLONASE ) 50 MCG/ACT nasal spray; Place 2 sprays into both nostrils daily.   Dispense: 16 g; Refill: 0  Rx Augmentin .  Increase fluids.  Rest.  Saline nasal spray.  Probiotic.  Mucinex as directed.  Humidifier in bedroom. Sterapred and Flonase  per orders.  Call or return to clinic if symptoms are not improving.   Follow Up Instructions: I discussed the assessment and treatment plan with the patient. The patient was provided an opportunity to ask questions and all were answered. The patient agreed with the plan and demonstrated an understanding of the instructions.  A copy of instructions were sent to the patient via MyChart unless otherwise noted below.   The patient was advised to call back or seek an in-person evaluation if the symptoms worsen or if the condition fails to improve as anticipated.    Willie Velma Lunger, PA-C    [1] No Known Allergies [2]  Current Outpatient Medications:    amoxicillin -clavulanate (AUGMENTIN ) 875-125 MG tablet, Take 1 tablet by mouth 2 (two) times daily., Disp: 14 tablet, Rfl: 0   fluticasone  (FLONASE ) 50 MCG/ACT nasal spray, Place 2 sprays into both nostrils daily., Disp: 16 g, Rfl: 0   predniSONE  (STERAPRED UNI-PAK 21 TAB) 10 MG (21) TBPK tablet, Take following package directions, Disp: 21 tablet, Rfl: 0   aspirin EC 81 MG tablet, Take 1 tablet (81 mg total) by mouth daily., Disp: , Rfl:    atenolol  (TENORMIN ) 25 MG tablet, Take 1 tablet (25 mg total) by mouth daily., Disp: 90 tablet, Rfl: 3   atorvastatin  (LIPITOR) 20 MG tablet, Take 1 tablet (20 mg total) by mouth daily., Disp: 90 tablet, Rfl: 3   buPROPion  ER (WELLBUTRIN  SR) 100 MG 12 hr tablet, Take 100 mg by mouth 2 (two) times daily., Disp: , Rfl:    busPIRone (BUSPAR) 10 MG tablet, Take 10 mg by mouth 2 (two) times daily., Disp: , Rfl:    Dextromethorphan HBr 15 MG CAPS, Take 45 mg by mouth 2 (two) times daily., Disp: , Rfl:    lisinopril  (ZESTRIL ) 10 MG tablet, Take 1 tablet (10 mg total) by mouth daily., Disp: 90 tablet, Rfl: 3   montelukast  (SINGULAIR ) 10 MG tablet,  Take 1 tablet (10 mg total) by mouth at bedtime., Disp: 90 tablet, Rfl: 3   traZODone (DESYREL) 50 MG tablet, Take 50 mg by mouth at bedtime., Disp: , Rfl:   "

## 2024-12-08 ENCOUNTER — Other Ambulatory Visit

## 2024-12-14 ENCOUNTER — Encounter: Admitting: Family Medicine
